# Patient Record
Sex: Male | Born: 1965
Health system: Southern US, Community
[De-identification: ages and names within clinical notes are randomized; demographics above are authoritative.]

---

## 2000-11-23 ENCOUNTER — Emergency Department (HOSPITAL_COMMUNITY): Admission: EM | Admit: 2000-11-23 | Discharge: 2000-11-23 | Payer: Self-pay | Admitting: Emergency Medicine

## 2000-11-23 ENCOUNTER — Emergency Department (HOSPITAL_COMMUNITY): Admission: EM | Admit: 2000-11-23 | Discharge: 2000-11-23 | Payer: Self-pay | Admitting: Internal Medicine

## 2000-11-23 ENCOUNTER — Encounter: Payer: Self-pay | Admitting: Internal Medicine

## 2007-02-17 ENCOUNTER — Emergency Department (HOSPITAL_COMMUNITY): Admission: EM | Admit: 2007-02-17 | Discharge: 2007-02-17 | Payer: Self-pay | Admitting: Emergency Medicine

## 2012-03-15 ENCOUNTER — Encounter (HOSPITAL_COMMUNITY): Payer: Self-pay | Admitting: *Deleted

## 2012-03-15 ENCOUNTER — Emergency Department (HOSPITAL_COMMUNITY): Payer: Self-pay

## 2012-03-15 ENCOUNTER — Emergency Department (HOSPITAL_COMMUNITY)
Admission: EM | Admit: 2012-03-15 | Discharge: 2012-03-16 | Disposition: A | Discharge status: Home / Self Care | Payer: Self-pay | Attending: Emergency Medicine | Admitting: Emergency Medicine

## 2012-03-15 DIAGNOSIS — S20219A Contusion of unspecified front wall of thorax, initial encounter: Secondary | ICD-10-CM | POA: Insufficient documentation

## 2012-03-15 DIAGNOSIS — S199XXA Unspecified injury of neck, initial encounter: Secondary | ICD-10-CM | POA: Insufficient documentation

## 2012-03-15 DIAGNOSIS — S022XXA Fracture of nasal bones, initial encounter for closed fracture: Secondary | ICD-10-CM | POA: Insufficient documentation

## 2012-03-15 DIAGNOSIS — F17200 Nicotine dependence, unspecified, uncomplicated: Secondary | ICD-10-CM | POA: Insufficient documentation

## 2012-03-15 DIAGNOSIS — S0993XA Unspecified injury of face, initial encounter: Secondary | ICD-10-CM | POA: Insufficient documentation

## 2012-03-15 DIAGNOSIS — R040 Epistaxis: Secondary | ICD-10-CM | POA: Insufficient documentation

## 2012-03-15 NOTE — ED Notes (Addendum)
Pt on cell phone
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
## 2012-03-15 NOTE — ED Notes (Signed)
Pt requesting pictures to be taken. Charge nurse to look for camera

## 2012-03-15 NOTE — ED Notes (Addendum)
Pictures were taken per pt's request  with camera out of Clint Bolder, AD office. Will place camera back in her office per charge nurse.
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
## 2012-11-17 ENCOUNTER — Inpatient Hospital Stay (HOSPITAL_COMMUNITY)
Admission: EM | Admit: 2012-11-17 | Discharge: 2012-11-18 | DRG: 909 | Disposition: A | Discharge status: Home / Self Care | Payer: PRIVATE HEALTH INSURANCE | Attending: General Surgery | Admitting: General Surgery

## 2012-11-17 ENCOUNTER — Encounter (HOSPITAL_COMMUNITY): Payer: Self-pay

## 2012-11-17 ENCOUNTER — Encounter (HOSPITAL_COMMUNITY): Admission: EM | Disposition: A | Discharge status: Home / Self Care | Payer: Self-pay | Source: Home / Self Care

## 2012-11-17 ENCOUNTER — Emergency Department (HOSPITAL_COMMUNITY): Payer: PRIVATE HEALTH INSURANCE

## 2012-11-17 ENCOUNTER — Encounter (HOSPITAL_COMMUNITY): Payer: Self-pay | Admitting: Anesthesiology

## 2012-11-17 ENCOUNTER — Emergency Department (HOSPITAL_COMMUNITY): Payer: PRIVATE HEALTH INSURANCE | Admitting: Anesthesiology

## 2012-11-17 DIAGNOSIS — S71111A Laceration without foreign body, right thigh, initial encounter: Secondary | ICD-10-CM

## 2012-11-17 DIAGNOSIS — R209 Unspecified disturbances of skin sensation: Secondary | ICD-10-CM | POA: Diagnosis present

## 2012-11-17 DIAGNOSIS — S71109A Unspecified open wound, unspecified thigh, initial encounter: Secondary | ICD-10-CM

## 2012-11-17 DIAGNOSIS — S71009A Unspecified open wound, unspecified hip, initial encounter: Secondary | ICD-10-CM | POA: Diagnosis present

## 2012-11-17 DIAGNOSIS — S15309A Unspecified injury of unspecified internal jugular vein, initial encounter: Secondary | ICD-10-CM

## 2012-11-17 DIAGNOSIS — S0181XA Laceration without foreign body of other part of head, initial encounter: Secondary | ICD-10-CM

## 2012-11-17 DIAGNOSIS — R404 Transient alteration of awareness: Secondary | ICD-10-CM | POA: Diagnosis present

## 2012-11-17 DIAGNOSIS — S0180XA Unspecified open wound of other part of head, initial encounter: Secondary | ICD-10-CM

## 2012-11-17 DIAGNOSIS — F17200 Nicotine dependence, unspecified, uncomplicated: Secondary | ICD-10-CM | POA: Diagnosis present

## 2012-11-17 DIAGNOSIS — F1410 Cocaine abuse, uncomplicated: Secondary | ICD-10-CM | POA: Diagnosis present

## 2012-11-17 DIAGNOSIS — S1191XA Laceration without foreign body of unspecified part of neck, initial encounter: Secondary | ICD-10-CM

## 2012-11-17 DIAGNOSIS — S1190XA Unspecified open wound of unspecified part of neck, initial encounter: Secondary | ICD-10-CM | POA: Diagnosis present

## 2012-11-17 HISTORY — PX: OTHER SURGICAL HISTORY: SHX169

## 2012-11-17 HISTORY — PX: ENDARTERECTOMY: SHX5162

## 2012-11-17 LAB — CREATININE, SERUM: GFR calc Af Amer: >90 mL/min (ref >90)

## 2012-11-17 LAB — CBC WITH DIFFERENTIAL/PLATELET
Basophils Absolute: 0 10*3/uL (ref 0.0–0.1)
HCT: 38.8 % — ABNORMAL LOW (ref 39.0–52.0)
Hemoglobin: 13.4 g/dL (ref 13.0–17.0)
Lymphocytes Relative: 14 % (ref 12–46)
MCHC: 34.5 g/dL (ref 30.0–36.0)
Monocytes Absolute: 0.9 10*3/uL (ref 0.1–1.0)
Monocytes Relative: 8 % (ref 3–12)
Neutro Abs: 8.8 10*3/uL — ABNORMAL HIGH (ref 1.7–7.7)
Neutrophils Relative %: 77 % (ref 43–77)
Platelets: 253 10*3/uL (ref 150–400)
RDW: 13.1 % (ref 11.5–15.5)
WBC: 11.4 10*3/uL — ABNORMAL HIGH (ref 4.0–10.5)

## 2012-11-17 LAB — COMPREHENSIVE METABOLIC PANEL
ALT: 20 U/L (ref 0–53)
AST: 25 U/L (ref 0–37)
Albumin: 3.5 g/dL (ref 3.5–5.2)
Alkaline Phosphatase: 69 U/L (ref 39–117)
CO2: 21 mEq/L (ref 19–32)
Chloride: 104 mEq/L (ref 96–112)
Creatinine, Ser: 1.12 mg/dL (ref 0.50–1.35)
GFR calc non Af Amer: 75 mL/min — ABNORMAL LOW (ref >90)
Potassium: 3.7 mEq/L (ref 3.5–5.1)
Total Bilirubin: 0.2 mg/dL — ABNORMAL LOW (ref 0.3–1.2)

## 2012-11-17 LAB — PROTIME-INR: INR: 0.95 (ref 0.00–1.49)

## 2012-11-17 LAB — CBC
Hemoglobin: 12.6 g/dL — ABNORMAL LOW (ref 13.0–17.0)
MCH: 32.1 pg (ref 26.0–34.0)
MCHC: 35.4 g/dL (ref 30.0–36.0)
MCV: 90.6 fL (ref 78.0–100.0)
Platelets: 246 10*3/uL (ref 150–400)
RDW: 13.3 % (ref 11.5–15.5)
WBC: 13.4 10*3/uL — ABNORMAL HIGH (ref 4.0–10.5)

## 2012-11-17 LAB — TYPE AND SCREEN: Antibody Screen: NEGATIVE

## 2012-11-17 LAB — ABO/RH: ABO/RH(D): O POS

## 2012-11-17 LAB — MRSA PCR SCREENING: MRSA by PCR: NEGATIVE

## 2012-11-17 SURGERY — ENDARTERECTOMY, CAROTID
Anesthesia: General | Site: Neck | Laterality: Right | Wound class: Dirty or Infected

## 2012-11-17 MED ORDER — SUCCINYLCHOLINE CHLORIDE 20 MG/ML IJ SOLN
INTRAMUSCULAR | Status: DC | PRN
  Administered 2012-11-17: 140 mg via INTRAVENOUS

## 2012-11-17 MED ORDER — TEMAZEPAM 15 MG PO CAPS: 15.0000 mg | ORAL_CAPSULE | Freq: Every evening | ORAL | Status: DC | PRN

## 2012-11-17 MED ORDER — DOCUSATE SODIUM 100 MG PO CAPS
100.0000 mg | ORAL_CAPSULE | Freq: Every day | ORAL | Status: DC
  Administered 2012-11-18: 100 mg via ORAL
  Filled 2012-11-17: qty 1

## 2012-11-17 MED ORDER — METOPROLOL TARTRATE 1 MG/ML IV SOLN: 2.0000 mg | INTRAVENOUS | Status: DC | PRN

## 2012-11-17 MED ORDER — FENTANYL CITRATE 0.05 MG/ML IJ SOLN
INTRAMUSCULAR | Status: AC
  Filled 2012-11-17: qty 2

## 2012-11-17 MED ORDER — FENTANYL CITRATE 0.05 MG/ML IJ SOLN
INTRAMUSCULAR | Status: AC
  Administered 2012-11-17: 50 ug via INTRAVENOUS
  Filled 2012-11-17: qty 2

## 2012-11-17 MED ORDER — GUAIFENESIN-DM 100-10 MG/5ML PO SYRP: 15.0000 mL | ORAL_SOLUTION | ORAL | Status: DC | PRN

## 2012-11-17 MED ORDER — PHENOL 1.4 % MT LIQD: 1.0000 | OROMUCOSAL | Status: DC | PRN

## 2012-11-17 MED ORDER — 0.9 % SODIUM CHLORIDE (POUR BTL) OPTIME
TOPICAL | Status: DC | PRN
  Administered 2012-11-17: 2000 mL

## 2012-11-17 MED ORDER — OXYCODONE HCL 5 MG PO TABS
ORAL_TABLET | ORAL | Status: AC
  Administered 2012-11-17: 10 mg
  Filled 2012-11-17: qty 2

## 2012-11-17 MED ORDER — DOPAMINE-DEXTROSE 3.2-5 MG/ML-% IV SOLN: 3.0000 ug/kg/min | INTRAVENOUS | Status: DC

## 2012-11-17 MED ORDER — FENTANYL CITRATE 0.05 MG/ML IJ SOLN
25.0000 ug | INTRAMUSCULAR | Status: DC | PRN
  Administered 2012-11-17 (×4): 25 ug via INTRAVENOUS
  Administered 2012-11-17: 50 ug via INTRAVENOUS

## 2012-11-17 MED ORDER — SODIUM CHLORIDE 0.9 % IV SOLN
INTRAVENOUS | Status: DC
  Administered 2012-11-17: 14:00:00 via INTRAVENOUS
  Administered 2012-11-18: 1000 mL via INTRAVENOUS

## 2012-11-17 MED ORDER — ACETAMINOPHEN 650 MG RE SUPP: 325.0000 mg | RECTAL | Status: DC | PRN

## 2012-11-17 MED ORDER — CEFAZOLIN SODIUM-DEXTROSE 2-3 GM-% IV SOLR
INTRAVENOUS | Status: DC | PRN
  Administered 2012-11-17: 2 g via INTRAVENOUS

## 2012-11-17 MED ORDER — METOCLOPRAMIDE HCL 5 MG/ML IJ SOLN: 10.0000 mg | Freq: Once | INTRAMUSCULAR | Status: DC | PRN

## 2012-11-17 MED ORDER — IOHEXOL 350 MG/ML SOLN
100.0000 mL | Freq: Once | INTRAVENOUS | Status: AC | PRN
  Administered 2012-11-17: 100 mL via INTRAVENOUS

## 2012-11-17 MED ORDER — LACTATED RINGERS IV SOLN
INTRAVENOUS | Status: DC | PRN
  Administered 2012-11-17 (×2): via INTRAVENOUS

## 2012-11-17 MED ORDER — MORPHINE SULFATE 2 MG/ML IJ SOLN
2.0000 mg | INTRAMUSCULAR | Status: DC | PRN
  Administered 2012-11-17 (×2): 2 mg via INTRAVENOUS

## 2012-11-17 MED ORDER — THROMBIN 20000 UNITS EX SOLR
CUTANEOUS | Status: AC
  Filled 2012-11-17: qty 20000

## 2012-11-17 MED ORDER — VECURONIUM BROMIDE 10 MG IV SOLR
INTRAVENOUS | Status: DC | PRN
  Administered 2012-11-17: 2 mg via INTRAVENOUS

## 2012-11-17 MED ORDER — SODIUM CHLORIDE 0.9 % IV SOLN
Freq: Once | INTRAVENOUS | Status: AC
  Administered 2012-11-17: 04:00:00 via INTRAVENOUS

## 2012-11-17 MED ORDER — HYDRALAZINE HCL 20 MG/ML IJ SOLN: 10.0000 mg | INTRAMUSCULAR | Status: DC | PRN

## 2012-11-17 MED ORDER — POTASSIUM CHLORIDE CRYS ER 20 MEQ PO TBCR: 20.0000 meq | EXTENDED_RELEASE_TABLET | Freq: Once | ORAL | Status: AC | PRN

## 2012-11-17 MED ORDER — PROPOFOL 10 MG/ML IV BOLUS
INTRAVENOUS | Status: DC | PRN
  Administered 2012-11-17: 40 mg via INTRAVENOUS
  Administered 2012-11-17: 160 mg via INTRAVENOUS

## 2012-11-17 MED ORDER — SODIUM CHLORIDE 0.9 % IV BOLUS (SEPSIS): 1000.0000 mL | Freq: Once | INTRAVENOUS | Status: DC

## 2012-11-17 MED ORDER — SODIUM CHLORIDE 0.9 % IR SOLN
Status: DC | PRN
  Administered 2012-11-17: 08:00:00

## 2012-11-17 MED ORDER — ACETAMINOPHEN 325 MG PO TABS: 325.0000 mg | ORAL_TABLET | ORAL | Status: DC | PRN

## 2012-11-17 MED ORDER — DEXTROSE 5 % IV SOLN
1.5000 g | Freq: Two times a day (BID) | INTRAVENOUS | Status: AC
  Administered 2012-11-17 – 2012-11-18 (×2): 1.5 g via INTRAVENOUS
  Filled 2012-11-17 (×2): qty 1.5

## 2012-11-17 MED ORDER — SODIUM CHLORIDE 0.9 % IV SOLN
INTRAVENOUS | Status: DC | PRN
  Administered 2012-11-17: 07:00:00 via INTRAVENOUS

## 2012-11-17 MED ORDER — OXYCODONE HCL 5 MG PO TABS
5.0000 mg | ORAL_TABLET | ORAL | Status: DC | PRN
  Administered 2012-11-17 – 2012-11-18 (×3): 10 mg via ORAL
  Filled 2012-11-17 (×2): qty 2

## 2012-11-17 MED ORDER — PNEUMOCOCCAL VAC POLYVALENT 25 MCG/0.5ML IJ INJ
0.5000 mL | INJECTION | INTRAMUSCULAR | Status: AC
  Administered 2012-11-18: 0.5 mL via INTRAMUSCULAR
  Filled 2012-11-17: qty 0.5

## 2012-11-17 MED ORDER — FENTANYL CITRATE 0.05 MG/ML IJ SOLN
INTRAMUSCULAR | Status: DC | PRN
  Administered 2012-11-17: 100 ug via INTRAVENOUS

## 2012-11-17 MED ORDER — ENOXAPARIN SODIUM 40 MG/0.4ML ~~LOC~~ SOLN
40.0000 mg | SUBCUTANEOUS | Status: DC
  Administered 2012-11-18: 40 mg via SUBCUTANEOUS
  Filled 2012-11-17: qty 0.4

## 2012-11-17 MED ORDER — ONDANSETRON HCL 4 MG/2ML IJ SOLN: 4.0000 mg | Freq: Four times a day (QID) | INTRAMUSCULAR | Status: DC | PRN

## 2012-11-17 MED ORDER — CEFAZOLIN SODIUM-DEXTROSE 2-3 GM-% IV SOLR
INTRAVENOUS | Status: AC
  Filled 2012-11-17: qty 50

## 2012-11-17 MED ORDER — SODIUM CHLORIDE 0.9 % IV SOLN: 500.0000 mL | Freq: Once | INTRAVENOUS | Status: AC | PRN

## 2012-11-17 MED ORDER — MORPHINE SULFATE 4 MG/ML IJ SOLN
INTRAMUSCULAR | Status: AC
  Filled 2012-11-17: qty 1

## 2012-11-17 MED ORDER — ALUM & MAG HYDROXIDE-SIMETH 200-200-20 MG/5ML PO SUSP: 15.0000 mL | ORAL | Status: DC | PRN

## 2012-11-17 MED ORDER — OXYCODONE HCL 5 MG PO TABS
ORAL_TABLET | ORAL | Status: AC
  Filled 2012-11-17: qty 2

## 2012-11-17 MED ORDER — LABETALOL HCL 5 MG/ML IV SOLN: 10.0000 mg | INTRAVENOUS | Status: DC | PRN

## 2012-11-17 MED ORDER — ONDANSETRON HCL 4 MG/2ML IJ SOLN
INTRAMUSCULAR | Status: DC | PRN
  Administered 2012-11-17: 4 mg via INTRAVENOUS

## 2012-11-17 MED ORDER — PANTOPRAZOLE SODIUM 40 MG PO TBEC
40.0000 mg | DELAYED_RELEASE_TABLET | Freq: Every day | ORAL | Status: DC
  Administered 2012-11-17 – 2012-11-18 (×2): 40 mg via ORAL
  Filled 2012-11-17 (×2): qty 1

## 2012-11-17 SURGICAL SUPPLY — 49 items
CANISTER SUCTION 2500CC (MISCELLANEOUS) ×2 IMPLANT
CANNULA VESSEL 3MM 2 BLNT TIP (CANNULA) ×2 IMPLANT
CATH ROBINSON RED A/P 18FR (CATHETERS) IMPLANT
CLIP TI MEDIUM 6 (CLIP) ×2 IMPLANT
CLIP TI WIDE RED SMALL 6 (CLIP) ×2 IMPLANT
CLSR STERI-STRIP ANTIMIC 1/2X4 (GAUZE/BANDAGES/DRESSINGS) ×2 IMPLANT
COVER SURGICAL LIGHT HANDLE (MISCELLANEOUS) ×2 IMPLANT
CRADLE DONUT ADULT HEAD (MISCELLANEOUS) ×2 IMPLANT
DECANTER SPIKE VIAL GLASS SM (MISCELLANEOUS) IMPLANT
DERMABOND ADVANCED (GAUZE/BANDAGES/DRESSINGS) ×1
DERMABOND ADVANCED .7 DNX12 (GAUZE/BANDAGES/DRESSINGS) ×1 IMPLANT
DRAIN HEMOVAC 1/8 X 5 (WOUND CARE) IMPLANT
DRAPE WARM FLUID 44X44 (DRAPE) ×2 IMPLANT
ELECT REM PT RETURN 9FT ADLT (ELECTROSURGICAL) ×2
ELECTRODE REM PT RTRN 9FT ADLT (ELECTROSURGICAL) ×1 IMPLANT
EVACUATOR SILICONE 100CC (DRAIN) IMPLANT
GAUZE SPONGE 2X2 8PLY STRL LF (GAUZE/BANDAGES/DRESSINGS) ×1 IMPLANT
GEL ULTRASOUND 20GR AQUASONIC (MISCELLANEOUS) ×2 IMPLANT
GLOVE BIO SURGEON STRL SZ 6.5 (GLOVE) ×6 IMPLANT
GLOVE BIO SURGEON STRL SZ7.5 (GLOVE) ×2 IMPLANT
GLOVE BIOGEL PI IND STRL 6.5 (GLOVE) ×4 IMPLANT
GLOVE BIOGEL PI INDICATOR 6.5 (GLOVE) ×4
GOWN PREVENTION PLUS XLARGE (GOWN DISPOSABLE) ×2 IMPLANT
GOWN STRL NON-REIN LRG LVL3 (GOWN DISPOSABLE) ×4 IMPLANT
KIT BASIN OR (CUSTOM PROCEDURE TRAY) ×2 IMPLANT
KIT ROOM TURNOVER OR (KITS) ×2 IMPLANT
LOOP VESSEL MINI RED (MISCELLANEOUS) IMPLANT
NEEDLE HYPO 25GX1X1/2 BEV (NEEDLE) IMPLANT
NS IRRIG 1000ML POUR BTL (IV SOLUTION) ×4 IMPLANT
PACK CAROTID (CUSTOM PROCEDURE TRAY) ×2 IMPLANT
PAD ARMBOARD 7.5X6 YLW CONV (MISCELLANEOUS) ×4 IMPLANT
SHUNT CAROTID BYPASS 10 (VASCULAR PRODUCTS) IMPLANT
SHUNT CAROTID BYPASS 12FRX15.5 (VASCULAR PRODUCTS) IMPLANT
SPONGE GAUZE 2X2 STER 10/PKG (GAUZE/BANDAGES/DRESSINGS) ×1
SPONGE SURGIFOAM ABS GEL 100 (HEMOSTASIS) IMPLANT
SUT ETHILON 3 0 PS 1 (SUTURE) IMPLANT
SUT PROLENE 5 0 C 1 24 (SUTURE) ×4 IMPLANT
SUT PROLENE 6 0 CC (SUTURE) ×2 IMPLANT
SUT PROLENE 7 0 BV 1 (SUTURE) IMPLANT
SUT SILK 3 0 TIES 17X18 (SUTURE)
SUT SILK 3-0 18XBRD TIE BLK (SUTURE) IMPLANT
SUT VIC AB 3-0 SH 27 (SUTURE) ×1
SUT VIC AB 3-0 SH 27X BRD (SUTURE) ×1 IMPLANT
SUT VICRYL 4-0 PS2 18IN ABS (SUTURE) ×2 IMPLANT
SYR CONTROL 10ML LL (SYRINGE) IMPLANT
TAPE CLOTH SURG 4X10 WHT LF (GAUZE/BANDAGES/DRESSINGS) ×2 IMPLANT
TOWEL OR 17X24 6PK STRL BLUE (TOWEL DISPOSABLE) ×2 IMPLANT
TOWEL OR 17X26 10 PK STRL BLUE (TOWEL DISPOSABLE) ×2 IMPLANT
WATER STERILE IRR 1000ML POUR (IV SOLUTION) ×2 IMPLANT

## 2012-11-17 NOTE — OR Nursing (Signed)
Two bags of patient belongings brought to Operating Room with the patient.  Patient underwear placed in belongings and then bags remained closed during case and were labeled with patient ID sticker.
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
## 2012-11-17 NOTE — ED Notes (Signed)
Transported patient to OR assignment.
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
## 2012-11-17 NOTE — ED Notes (Signed)
Called Thayer Ohm RN at [facility] ED, report given, transport leaving with pt.
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
## 2012-11-17 NOTE — Anesthesia Preprocedure Evaluation (Addendum)
Anesthesia Evaluation  Patient identified by MRN, date of birth, ID band Patient awake    Reviewed: Allergy & Precautions, H&P , NPO status , Patient's Chart, lab work & pertinent test results, reviewed documented beta blocker date and time   Airway Mallampati: III TM Distance: >3 FB Neck ROM: full Positive for:  Tracheal deviation Mouth opening: Limited Mouth Opening  Dental   Pulmonary Current Smoker,  breath sounds clear to auscultation        Cardiovascular Rhythm:regular     Neuro/Psych negative neurological ROS  negative psych ROS   GI/Hepatic negative GI ROS, Neg liver ROS,   Endo/Other  negative endocrine ROS  Renal/GU negative Renal ROS  negative genitourinary   Musculoskeletal   Abdominal   Peds  Hematology negative hematology ROS (+)   Anesthesia Other Findings See surgeon's H&P   Reproductive/Obstetrics negative OB ROS                           Anesthesia Physical Anesthesia Plan  ASA: IV and emergent  Anesthesia Plan: General   Post-op Pain Management:    Induction: Intravenous, Rapid sequence and Cricoid pressure planned  Airway Management Planned: Video Laryngoscope Planned  Additional Equipment:   Intra-op Plan:   Post-operative Plan: Extubation in OR  Informed Consent: I have reviewed the patients History and Physical, chart, labs and discussed the procedure including the risks, benefits and alternatives for the proposed anesthesia with the patient or authorized representative who has indicated his/her understanding and acceptance.   Dental Advisory Given  Plan Discussed with: CRNA and Surgeon  Anesthesia Plan Comments:         Anesthesia Quick Evaluation

## 2012-11-17 NOTE — Anesthesia Procedure Notes (Signed)
Procedure Name: Intubation Date/Time: 11/17/2012 7:00 AM Performed by: Alanda Amass A Pre-anesthesia Checklist: Patient identified, Timeout performed, Emergency Drugs available, Suction available and Patient being monitored Patient Re-evaluated:Patient Re-evaluated prior to inductionOxygen Delivery Method: Circle system utilized Preoxygenation: Pre-oxygenation with 100% oxygen Intubation Type: IV induction, Rapid sequence and Cricoid Pressure applied Tube type: Oral Tube size: 7.5 mm Number of attempts: 1 Airway Equipment and Method: Video-laryngoscopy and Stylet Placement Confirmation: ETT inserted through vocal cords under direct vision,  breath sounds checked- equal and bilateral and positive ETCO2 Secured at: 21 cm Tube secured with: Tape Dental Injury: Teeth and Oropharynx as per pre-operative assessment
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
## 2012-11-17 NOTE — Preoperative (Signed)
Beta Blockers   Reason not to administer Beta Blockers:Not on Beta Blocker

## 2012-11-18 ENCOUNTER — Telehealth: Payer: Self-pay | Admitting: Vascular Surgery

## 2012-11-18 LAB — BASIC METABOLIC PANEL
BUN: 9 mg/dL (ref 6–23)
CO2: 25 mEq/L (ref 19–32)
Calcium: 8.2 mg/dL — ABNORMAL LOW (ref 8.4–10.5)
Creatinine, Ser: 0.91 mg/dL (ref 0.50–1.35)
GFR calc non Af Amer: >90 mL/min (ref >90)
Glucose, Bld: 118 mg/dL — ABNORMAL HIGH (ref 70–99)
Potassium: 4 mEq/L (ref 3.5–5.1)
Sodium: 135 mEq/L (ref 135–145)

## 2012-11-18 LAB — CBC
HCT: 36.3 % — ABNORMAL LOW (ref 39.0–52.0)
Hemoglobin: 12.2 g/dL — ABNORMAL LOW (ref 13.0–17.0)
MCH: 30.7 pg (ref 26.0–34.0)
MCHC: 33.6 g/dL (ref 30.0–36.0)
MCV: 91.2 fL (ref 78.0–100.0)
RBC: 3.98 MIL/uL — ABNORMAL LOW (ref 4.22–5.81)

## 2012-11-18 MED ORDER — OXYCODONE HCL 5 MG PO TABS: 5.0000 mg | ORAL_TABLET | Freq: Four times a day (QID) | ORAL | Status: DC | PRN

## 2012-11-18 NOTE — Discharge Summary (Signed)
Seen, agree with above.  Eating, wound looks OK.   Discharge to home.
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
## 2012-11-18 NOTE — Progress Notes (Signed)
UR completed.  Carleen Rhue, RN BSN M[facility] CCM Trauma/Neuro ICU Case Manager [phone]  
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
## 2012-11-18 NOTE — Progress Notes (Signed)
Pt discharged to home via taxi voucher to dad's home. Discharged instructions, f/u appt.,prescriptions, discussed and explained to pt.
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
## 2012-11-18 NOTE — Progress Notes (Signed)
Nutrition Brief Note  Patient identified on the Malnutrition Screening Tool (MST) Report for recent weight lost without trying.  Per readings below, patient has has 3% weight loss since January 2014; not significant for time frame.  Wt Readings from Last 15 Encounters:  11/17/12 143 lb 4.8 oz (65 kg)  11/17/12 143 lb 4.8 oz (65 kg)  03/15/12 147 lb (66.679 kg)    Body mass index is 21.15 kg/(m^2). Patient meets criteria for Normal based on current BMI.   Current diet order is Regular, patient is consuming approximately 100% of meals at this time. Labs and medications reviewed.   No nutrition interventions warranted at this time. If nutrition issues arise, please consult RD.   Maureen Chatters, RD, LDN Pager #: [phone] After-Hours Pager #: [phone]
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
## 2012-11-19 ENCOUNTER — Encounter (HOSPITAL_COMMUNITY): Payer: Self-pay | Admitting: Vascular Surgery

## 2012-11-27 ENCOUNTER — Telehealth: Payer: Self-pay

## 2012-11-27 NOTE — Telephone Encounter (Signed)
Pt. 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  C/o numbness in right shoulder down to right thumb since started lifting the heavy boxes; reports the numbness is continuous.  Reports that "the right hand feels chilly compared to the left hand".  Instructed pt. on checking capillary refill; he stated that the color of the nailbeds "look normal" after squeezing each finger.  C/o a continuous shooting pain in the thumb area since Monday, when he resumed work.  Questioned pt. on ability to grip with (R) hand; stated he has trouble picking-up 2 liter bottles, and in squeezing the staple gun at work, due to weakness in gripping ability.  Advised pt. To inform employer he is instructed to avoid heavy lifting x 2 weeks, and will not be released to full work activities until seen in office 12/05/12.  Verb. Understanding.  Will make Dr. Darrick Penna aware of symptoms.

## 2012-12-02 ENCOUNTER — Telehealth (HOSPITAL_COMMUNITY): Payer: Self-pay | Admitting: Emergency Medicine

## 2012-12-02 NOTE — Telephone Encounter (Signed)
Has an appointment with vascular surgery on Thursday and is just going to wait until then to address it.

## 2012-12-04 ENCOUNTER — Encounter: Payer: Self-pay | Admitting: Vascular Surgery

## 2012-12-05 ENCOUNTER — Encounter: Payer: Self-pay | Admitting: Vascular Surgery

## 2012-12-05 ENCOUNTER — Ambulatory Visit (INDEPENDENT_AMBULATORY_CARE_PROVIDER_SITE_OTHER): Payer: Self-pay | Admitting: Vascular Surgery

## 2012-12-05 VITALS — BP 123/69 | HR 76 | Ht 69.0 in | Wt 146.2 lb

## 2012-12-05 DIAGNOSIS — T819XXS Unspecified complication of procedure, sequela: Secondary | ICD-10-CM

## 2012-12-05 DIAGNOSIS — S1191XD Laceration without foreign body of unspecified part of neck, subsequent encounter: Secondary | ICD-10-CM

## 2012-12-05 DIAGNOSIS — T889XXS Complication of surgical and medical care, unspecified, sequela: Secondary | ICD-10-CM

## 2012-12-05 DIAGNOSIS — Z5189 Encounter for other specified aftercare: Secondary | ICD-10-CM

## 2012-12-05 MED ORDER — TRAMADOL HCL 50 MG PO TABS: 50.0000 mg | ORAL_TABLET | Freq: Four times a day (QID) | ORAL | Status: DC | PRN
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
## 2012-12-10 NOTE — Addendum Note (Signed)
Addended by: Sharee Pimple on: 12/10/2012 08:50 AM   Modules accepted: Orders

## 2012-12-12 ENCOUNTER — Telehealth: Payer: Self-pay | Admitting: Vascular Surgery

## 2012-12-12 NOTE — Telephone Encounter (Signed)
Patient was going to contact case worker to see if he can get his medicaid so he would be able to have this test done. I spoke to him on 12/10/12 and he said he was still trying to get through to her. I told him that if he would like me to speak with her, to have her call me. I have not heard from either. Called his house today 12/12/12. He was not home. Friend said he would have him call when he gets in.

## 2012-12-13 ENCOUNTER — Other Ambulatory Visit: Payer: Self-pay

## 2012-12-13 DIAGNOSIS — Z48812 Encounter for surgical aftercare following surgery on the circulatory system: Secondary | ICD-10-CM

## 2012-12-13 DIAGNOSIS — S1191XD Laceration without foreign body of unspecified part of neck, subsequent encounter: Secondary | ICD-10-CM

## 2012-12-19 ENCOUNTER — Other Ambulatory Visit: Payer: Self-pay | Admitting: *Deleted

## 2012-12-19 DIAGNOSIS — R29898 Other symptoms and signs involving the musculoskeletal system: Secondary | ICD-10-CM

## 2012-12-19 DIAGNOSIS — R200 Anesthesia of skin: Secondary | ICD-10-CM

## 2012-12-19 DIAGNOSIS — Z48812 Encounter for surgical aftercare following surgery on the circulatory system: Secondary | ICD-10-CM

## 2012-12-30 ENCOUNTER — Ambulatory Visit: Payer: Self-pay | Admitting: Physical Therapy

## 2013-01-07 ENCOUNTER — Ambulatory Visit: Payer: PRIVATE HEALTH INSURANCE | Attending: Vascular Surgery | Admitting: Physical Therapy

## 2013-01-07 DIAGNOSIS — M6281 Muscle weakness (generalized): Secondary | ICD-10-CM | POA: Insufficient documentation

## 2013-01-07 DIAGNOSIS — IMO0001 Reserved for inherently not codable concepts without codable children: Secondary | ICD-10-CM | POA: Insufficient documentation

## 2013-01-07 DIAGNOSIS — R5381 Other malaise: Secondary | ICD-10-CM | POA: Insufficient documentation

## 2013-01-07 DIAGNOSIS — M25519 Pain in unspecified shoulder: Secondary | ICD-10-CM | POA: Insufficient documentation

## 2013-01-13 ENCOUNTER — Ambulatory Visit: Payer: PRIVATE HEALTH INSURANCE | Attending: Vascular Surgery | Admitting: Physical Therapy

## 2013-01-13 DIAGNOSIS — M6281 Muscle weakness (generalized): Secondary | ICD-10-CM | POA: Insufficient documentation

## 2013-01-13 DIAGNOSIS — R5381 Other malaise: Secondary | ICD-10-CM | POA: Insufficient documentation

## 2013-01-13 DIAGNOSIS — Z0279 Encounter for issue of other medical certificate: Secondary | ICD-10-CM

## 2013-01-13 DIAGNOSIS — R29898 Other symptoms and signs involving the musculoskeletal system: Secondary | ICD-10-CM | POA: Insufficient documentation

## 2013-01-13 DIAGNOSIS — M25519 Pain in unspecified shoulder: Secondary | ICD-10-CM | POA: Insufficient documentation

## 2013-01-13 DIAGNOSIS — IMO0001 Reserved for inherently not codable concepts without codable children: Secondary | ICD-10-CM | POA: Insufficient documentation

## 2013-01-17 ENCOUNTER — Encounter: Payer: Self-pay | Admitting: Physical Therapy

## 2013-01-22 ENCOUNTER — Ambulatory Visit: Payer: PRIVATE HEALTH INSURANCE | Admitting: Physical Therapy

## 2013-01-28 ENCOUNTER — Ambulatory Visit: Payer: PRIVATE HEALTH INSURANCE | Admitting: Physical Therapy

## 2013-01-29 ENCOUNTER — Encounter: Payer: Self-pay | Admitting: Vascular Surgery

## 2013-01-30 ENCOUNTER — Encounter: Payer: Self-pay | Admitting: Vascular Surgery

## 2013-01-30 ENCOUNTER — Ambulatory Visit (INDEPENDENT_AMBULATORY_CARE_PROVIDER_SITE_OTHER): Payer: PRIVATE HEALTH INSURANCE | Admitting: Vascular Surgery

## 2013-01-30 VITALS — BP 140/87 | HR 98 | Resp 18 | Ht 69.0 in | Wt 153.0 lb

## 2013-01-30 DIAGNOSIS — R200 Anesthesia of skin: Secondary | ICD-10-CM | POA: Insufficient documentation

## 2013-01-30 DIAGNOSIS — M6281 Muscle weakness (generalized): Secondary | ICD-10-CM

## 2013-01-30 DIAGNOSIS — R29898 Other symptoms and signs involving the musculoskeletal system: Secondary | ICD-10-CM

## 2013-01-30 DIAGNOSIS — R209 Unspecified disturbances of skin sensation: Secondary | ICD-10-CM

## 2013-01-30 NOTE — Addendum Note (Signed)
Addended by: Sharee Pimple on: 01/30/2013 02:39 PM   Modules accepted: Orders
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
## 2013-02-04 ENCOUNTER — Telehealth: Payer: Self-pay | Admitting: Vascular Surgery

## 2013-02-04 ENCOUNTER — Encounter: Payer: PRIVATE HEALTH INSURANCE | Admitting: Physical Therapy

## 2013-02-04 NOTE — Telephone Encounter (Signed)
A referral was made to [facility] in [place] as per Dr. Darrick Penna request on 01/30/2013.  Irving Burton called from [place] today to notify our office that they are unable to schedule Frederick Roth due to his inability to pay out of pocket. He is self-pay and Medicaid is pending at this time.  Irving Burton instructed Frederick Studnicka to call to schedule an appointment when he is financially able, or when Medicaid is approved.  Berton Mount, [facility] [place] [phone]

## 2013-02-19 ENCOUNTER — Encounter: Payer: PRIVATE HEALTH INSURANCE | Admitting: Physical Therapy

## 2013-02-20 ENCOUNTER — Encounter: Payer: PRIVATE HEALTH INSURANCE | Admitting: Physical Therapy

## 2013-02-27 ENCOUNTER — Telehealth: Payer: Self-pay
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
## 2013-03-03 ENCOUNTER — Encounter: Payer: Self-pay | Admitting: *Deleted

## 2013-03-04 ENCOUNTER — Ambulatory Visit: Payer: Self-pay | Attending: Vascular Surgery | Admitting: Physical Therapy

## 2013-03-04 DIAGNOSIS — M25519 Pain in unspecified shoulder: Secondary | ICD-10-CM | POA: Insufficient documentation

## 2013-03-04 DIAGNOSIS — M6281 Muscle weakness (generalized): Secondary | ICD-10-CM | POA: Insufficient documentation

## 2013-03-04 DIAGNOSIS — IMO0001 Reserved for inherently not codable concepts without codable children: Secondary | ICD-10-CM | POA: Insufficient documentation

## 2013-03-04 DIAGNOSIS — R29898 Other symptoms and signs involving the musculoskeletal system: Secondary | ICD-10-CM | POA: Insufficient documentation

## 2013-03-04 DIAGNOSIS — R5381 Other malaise: Secondary | ICD-10-CM | POA: Insufficient documentation

## 2013-03-11 ENCOUNTER — Ambulatory Visit: Payer: Self-pay | Admitting: Physical Therapy

## 2013-04-15 DIAGNOSIS — Z0279 Encounter for issue of other medical certificate: Secondary | ICD-10-CM

## 2016-06-30 ENCOUNTER — Ambulatory Visit (HOSPITAL_COMMUNITY)
Admission: RE | Admit: 2016-06-30 | Discharge: 2016-06-30 | Disposition: A | Discharge status: Home / Self Care | Payer: Self-pay | Source: Ambulatory Visit | Attending: Family Medicine | Admitting: Family Medicine

## 2016-06-30 ENCOUNTER — Encounter: Payer: Self-pay | Admitting: Family Medicine

## 2016-06-30 ENCOUNTER — Ambulatory Visit: Payer: Self-pay | Attending: Family Medicine | Admitting: Family Medicine

## 2016-06-30 VITALS — BP 119/72 | HR 61 | Temp 98.0°F | Resp 18 | Ht 69.0 in | Wt 146.0 lb

## 2016-06-30 DIAGNOSIS — H538 Other visual disturbances: Secondary | ICD-10-CM | POA: Insufficient documentation

## 2016-06-30 DIAGNOSIS — R202 Paresthesia of skin: Secondary | ICD-10-CM | POA: Insufficient documentation

## 2016-06-30 DIAGNOSIS — H269 Unspecified cataract: Secondary | ICD-10-CM | POA: Insufficient documentation

## 2016-06-30 DIAGNOSIS — Z0000 Encounter for general adult medical examination without abnormal findings: Secondary | ICD-10-CM

## 2016-06-30 DIAGNOSIS — R531 Weakness: Secondary | ICD-10-CM | POA: Insufficient documentation

## 2016-06-30 DIAGNOSIS — Z87828 Personal history of other (healed) physical injury and trauma: Secondary | ICD-10-CM

## 2016-06-30 DIAGNOSIS — Z79899 Other long term (current) drug therapy: Secondary | ICD-10-CM | POA: Insufficient documentation

## 2016-06-30 DIAGNOSIS — M898X9 Other specified disorders of bone, unspecified site: Secondary | ICD-10-CM

## 2016-06-30 DIAGNOSIS — Z8669 Personal history of other diseases of the nervous system and sense organs: Secondary | ICD-10-CM

## 2016-06-30 DIAGNOSIS — H547 Unspecified visual loss: Secondary | ICD-10-CM

## 2016-06-30 DIAGNOSIS — M79609 Pain in unspecified limb: Secondary | ICD-10-CM | POA: Insufficient documentation

## 2016-06-30 MED ORDER — MELOXICAM 7.5 MG PO TABS
7.5000 mg | ORAL_TABLET | Freq: Every day | ORAL | 1 refills | Status: DC
Start: 2016-06-30 — End: 2017-07-17

## 2016-06-30 NOTE — Progress Notes (Signed)
Patient is here to establish care  Patient has taken medication today. Patient has eaten today.  Patient complains of left eye sight being obstructed. Patient has cataract surgery and [facility]
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
## 2016-06-30 NOTE — Patient Instructions (Addendum)
Apply for orange card to complete referral process.   Paresthesia Paresthesia is a burning or prickling feeling. This feeling can happen in any part of the body. It often happens in the hands, arms, legs, or feet. Usually, it is not painful. In most cases, the feeling goes away in a short time and is not a sign of a serious problem. Follow these instructions at home:  Avoid drinking alcohol.  Try massage or needle therapy (acupuncture) to help with your problems.  Keep all follow-up visits as told by your doctor. This is important. Contact a doctor if:  You keep on having episodes of paresthesia.  Your burning or prickling feeling gets worse when you walk.  You have pain or cramps.  You feel dizzy.  You have a rash. Get help right away if:  You feel weak.  You have trouble walking or moving.  You have problems speaking, understanding, or seeing.  You feel confused.  You cannot control when you pee (urinate) or poop (bowel movement).  You lose feeling (numbness) after an injury.  You pass out (faint). This information is not intended to replace advice given to you by your health care provider. Make sure you discuss any questions you have with your health care provider. Document Released: 01/13/2008 Document Revised: 07/08/2015 Document Reviewed: 01/26/2014 Elsevier Interactive Patient Education  2017 [employer]

## 2016-07-01 LAB — HIV ANTIBODY (ROUTINE TESTING W REFLEX): HIV SCREEN 4TH GENERATION: NONREACTIVE

## 2016-07-01 LAB — HEPATITIS C ANTIBODY

## 2016-07-07 ENCOUNTER — Telehealth: Payer: Self-pay | Admitting: *Deleted

## 2016-07-07 NOTE — Telephone Encounter (Signed)
MA unable to leave a voice message due to system not being set up. Please inform the patient of no fractures/dislocation was noted on the xray. HIV and Hep C were negative. Patient needs to complete Financial process.

## 2016-07-07 NOTE — Telephone Encounter (Signed)
-----   Message from Mandesia R Hairston, FNP sent at 07/07/2016 10:15 AM EDT ----- X-ray of right elbow showed no evidence of fracture, dislocation, abnormal bone, or swelling of the joint. Follow up with orthopedic referral. Apply for orange card to complete referral process.  HIV is negative.  Hepatitis C is negative 

## 2016-07-12 ENCOUNTER — Ambulatory Visit: Payer: Self-pay | Attending: Family Medicine

## 2016-07-12 ENCOUNTER — Telehealth: Payer: Self-pay

## 2016-07-12 NOTE — Telephone Encounter (Signed)
-----   Message from Mandesia R Hairston, FNP sent at 07/07/2016 10:15 AM EDT ----- X-ray of right elbow showed no evidence of fracture, dislocation, abnormal bone, or swelling of the joint. Follow up with orthopedic referral. Apply for orange card to complete referral process.  HIV is negative.  Hepatitis C is negative 

## 2016-07-12 NOTE — Telephone Encounter (Signed)
CMA call regarding results  Patient did not answer & no VM set up

## 2016-07-17 NOTE — Telephone Encounter (Signed)
CMA second time calling regarding lab results  Patient did not answer & no VM set up yet

## 2016-07-17 NOTE — Telephone Encounter (Signed)
-----   Message from Lizbeth BarkMandesia R Hairston, [place] sent at 07/07/2016 10:15 AM EDT ----- X-ray of right elbow showed no evidence of fracture, dislocation, abnormal bone, or swelling of the joint. Follow up with orthopedic referral. Apply for orange card to complete referral process.  HIV is negative.  Hepatitis C is negative

## 2016-10-24 ENCOUNTER — Ambulatory Visit: Payer: Self-pay | Attending: Family Medicine | Admitting: Family Medicine

## 2016-10-24 ENCOUNTER — Encounter: Payer: Self-pay | Admitting: Family Medicine

## 2016-10-24 VITALS — BP 103/67 | HR 54 | Temp 98.0°F | Resp 18 | Ht 69.0 in | Wt 157.8 lb

## 2016-10-24 DIAGNOSIS — R202 Paresthesia of skin: Secondary | ICD-10-CM | POA: Insufficient documentation

## 2016-10-24 DIAGNOSIS — Z87828 Personal history of other (healed) physical injury and trauma: Secondary | ICD-10-CM

## 2016-10-24 DIAGNOSIS — S39012A Strain of muscle, fascia and tendon of lower back, initial encounter: Secondary | ICD-10-CM | POA: Insufficient documentation

## 2016-10-24 DIAGNOSIS — X58XXXA Exposure to other specified factors, initial encounter: Secondary | ICD-10-CM | POA: Insufficient documentation

## 2016-10-24 DIAGNOSIS — M79609 Pain in unspecified limb: Secondary | ICD-10-CM

## 2016-10-24 DIAGNOSIS — Z79899 Other long term (current) drug therapy: Secondary | ICD-10-CM | POA: Insufficient documentation

## 2016-10-24 DIAGNOSIS — H538 Other visual disturbances: Secondary | ICD-10-CM | POA: Insufficient documentation

## 2016-10-24 DIAGNOSIS — M5442 Lumbago with sciatica, left side: Secondary | ICD-10-CM

## 2016-10-24 DIAGNOSIS — M5432 Sciatica, left side: Secondary | ICD-10-CM | POA: Insufficient documentation

## 2016-10-24 MED ORDER — METHOCARBAMOL 500 MG PO TABS: 500.0000 mg | ORAL_TABLET | Freq: Three times a day (TID) | ORAL | 0 refills | Status: DC | PRN

## 2016-10-24 MED ORDER — TRAMADOL HCL 50 MG PO TABS: 50.0000 mg | ORAL_TABLET | Freq: Three times a day (TID) | ORAL | 0 refills | Status: DC | PRN

## 2016-10-24 MED ORDER — NAPROXEN 500 MG PO TABS: ORAL_TABLET | ORAL | 0 refills | Status: DC

## 2016-10-24 NOTE — Patient Instructions (Signed)
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
## 2016-10-24 NOTE — Progress Notes (Signed)
Patient is here for left side back pain right arm pain & eye vision blurry   Patient had left eye cataract  took out

## 2016-10-27 ENCOUNTER — Ambulatory Visit (HOSPITAL_COMMUNITY)
Admission: RE | Admit: 2016-10-27 | Discharge: 2016-10-27 | Disposition: A | Discharge status: Home / Self Care | Payer: Self-pay | Source: Ambulatory Visit | Attending: Family Medicine | Admitting: Family Medicine

## 2016-10-27 DIAGNOSIS — M5442 Lumbago with sciatica, left side: Secondary | ICD-10-CM | POA: Insufficient documentation

## 2016-10-27 DIAGNOSIS — S39012A Strain of muscle, fascia and tendon of lower back, initial encounter: Secondary | ICD-10-CM

## 2016-10-27 DIAGNOSIS — M47896 Other spondylosis, lumbar region: Secondary | ICD-10-CM | POA: Insufficient documentation

## 2016-10-27 DIAGNOSIS — Z87828 Personal history of other (healed) physical injury and trauma: Secondary | ICD-10-CM | POA: Insufficient documentation

## 2016-11-01 ENCOUNTER — Other Ambulatory Visit: Payer: Self-pay | Admitting: Family Medicine

## 2016-11-01 ENCOUNTER — Telehealth: Payer: Self-pay

## 2016-11-01 DIAGNOSIS — M47816 Spondylosis without myelopathy or radiculopathy, lumbar region: Secondary | ICD-10-CM | POA: Insufficient documentation

## 2016-11-01 NOTE — Telephone Encounter (Signed)
-----   Message from Lizbeth Bark, FNP sent at 11/01/2016  8:54 AM EDT ----- Mild osteoarthritis to the lower spine. No evidence of fracture, normal spinal alignment.  You will be referred to PT.

## 2016-11-01 NOTE — Telephone Encounter (Signed)
CMA call regarding x ray results   Patient Verify DOB   Patient was aware and understood  

## 2016-11-15 ENCOUNTER — Ambulatory Visit (INDEPENDENT_AMBULATORY_CARE_PROVIDER_SITE_OTHER): Payer: Self-pay | Admitting: Orthopedic Surgery

## 2016-11-15 ENCOUNTER — Encounter (INDEPENDENT_AMBULATORY_CARE_PROVIDER_SITE_OTHER): Payer: Self-pay | Admitting: Orthopedic Surgery

## 2016-11-15 DIAGNOSIS — M5442 Lumbago with sciatica, left side: Secondary | ICD-10-CM

## 2016-11-15 MED ORDER — METHYLPREDNISOLONE 4 MG PO TABS: ORAL_TABLET | ORAL | 0 refills | Status: DC

## 2016-11-15 MED ORDER — BACLOFEN 10 MG PO TABS: 10.0000 mg | ORAL_TABLET | Freq: Two times a day (BID) | ORAL | 0 refills | Status: DC
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
## 2016-12-18 ENCOUNTER — Encounter (INDEPENDENT_AMBULATORY_CARE_PROVIDER_SITE_OTHER): Payer: Self-pay | Admitting: Orthopedic Surgery

## 2016-12-18 ENCOUNTER — Ambulatory Visit (INDEPENDENT_AMBULATORY_CARE_PROVIDER_SITE_OTHER): Payer: Self-pay | Admitting: Orthopedic Surgery

## 2016-12-18 DIAGNOSIS — M5442 Lumbago with sciatica, left side: Secondary | ICD-10-CM

## 2016-12-18 DIAGNOSIS — G8929 Other chronic pain: Secondary | ICD-10-CM
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
## 2017-01-02 ENCOUNTER — Ambulatory Visit
Admission: RE | Admit: 2017-01-02 | Discharge: 2017-01-02 | Disposition: A | Discharge status: Home / Self Care | Payer: No Typology Code available for payment source | Source: Ambulatory Visit | Attending: Orthopedic Surgery | Admitting: Orthopedic Surgery

## 2017-01-02 DIAGNOSIS — M5442 Lumbago with sciatica, left side: Principal | ICD-10-CM

## 2017-01-02 DIAGNOSIS — G8929 Other chronic pain: Secondary | ICD-10-CM

## 2017-01-10 ENCOUNTER — Encounter (INDEPENDENT_AMBULATORY_CARE_PROVIDER_SITE_OTHER): Payer: Self-pay | Admitting: Orthopedic Surgery

## 2017-01-10 ENCOUNTER — Ambulatory Visit (INDEPENDENT_AMBULATORY_CARE_PROVIDER_SITE_OTHER): Payer: Self-pay | Admitting: Orthopedic Surgery

## 2017-01-10 DIAGNOSIS — M5416 Radiculopathy, lumbar region: Secondary | ICD-10-CM
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
## 2017-01-15 ENCOUNTER — Ambulatory Visit: Payer: Self-pay

## 2017-01-15 ENCOUNTER — Ambulatory Visit: Payer: Self-pay | Attending: Family Medicine

## 2017-01-24 ENCOUNTER — Encounter (INDEPENDENT_AMBULATORY_CARE_PROVIDER_SITE_OTHER): Payer: Self-pay | Admitting: Physical Medicine and Rehabilitation

## 2017-01-31 ENCOUNTER — Encounter (INDEPENDENT_AMBULATORY_CARE_PROVIDER_SITE_OTHER): Payer: Self-pay | Admitting: Physical Medicine and Rehabilitation

## 2017-01-31 ENCOUNTER — Ambulatory Visit (INDEPENDENT_AMBULATORY_CARE_PROVIDER_SITE_OTHER): Payer: Self-pay | Admitting: Physical Medicine and Rehabilitation

## 2017-01-31 ENCOUNTER — Ambulatory Visit (INDEPENDENT_AMBULATORY_CARE_PROVIDER_SITE_OTHER): Payer: Self-pay

## 2017-01-31 VITALS — BP 112/69 | HR 49 | Ht 69.0 in | Wt 150.0 lb

## 2017-01-31 DIAGNOSIS — M5116 Intervertebral disc disorders with radiculopathy, lumbar region: Secondary | ICD-10-CM

## 2017-01-31 DIAGNOSIS — M5416 Radiculopathy, lumbar region: Secondary | ICD-10-CM

## 2017-01-31 MED ORDER — BETAMETHASONE SOD PHOS & ACET 6 (3-3) MG/ML IJ SUSP
12.0000 mg | Freq: Once | INTRAMUSCULAR | Status: AC
  Administered 2017-01-31: 12 mg

## 2017-01-31 MED ORDER — LIDOCAINE HCL (PF) 1 % IJ SOLN
2.0000 mL | Freq: Once | INTRAMUSCULAR | Status: AC
  Administered 2017-01-31: 2 mL

## 2017-01-31 NOTE — Patient Instructions (Signed)

## 2017-01-31 NOTE — Progress Notes (Deleted)
Patient presents with sharp left sided low back pain that radiates down his left leg to his foot.  The pain is alleviated somewhat if he lays on his side and bends his knees up. He does experience tingling down his left leg as well. He denies taking a blood thinner and has a driver with him today.

## 2017-02-15 NOTE — Procedures (Signed)
Mr. Frederick Roth is a 52 year old patient followed by Dr. August Saucerean who reports sharp left-sided low back pain that refers down his left leg to the medial portion of his foot.  He has a pretty classic L4 distribution to his pain.  He reports decreased symptoms if he lays on his side and bends his knee.  He has having paresthesias in the same distribution.  He denies any right-sided symptoms.  He has failed conservative care through Dr. August Saucerean an MRI of the lumbar spine was performed and this is reviewed below.  We are going to complete a left L4 transforaminal epidural steroid injection diagnostically and hopefully therapeutically.  The injection  will be diagnostic and hopefully therapeutic. The patient has failed conservative care including time, medications and activity modification.  Lumbosacral Transforaminal Epidural Steroid Injection - Sub-Pedicular Approach with Fluoroscopic Guidance  Patient: Frederick Roth      Date of Birth: [birth year] MRN: [id number] PCP: Lizbeth BarkHairston, Mandesia R, FNP      Visit Date: 01/31/2017   Universal Protocol:    Date/Time: 01/31/2017  Consent Given By: the patient  Position: PRONE  Additional Comments: Vital signs were monitored before and after the procedure. Patient was prepped and draped in the usual sterile fashion. The correct patient, procedure, and site was verified.   Injection Procedure Details:  Procedure Site One Meds Administered:  Meds ordered this encounter  Medications  . lidocaine (PF) (XYLOCAINE) 1 % injection 2 mL  . betamethasone acetate-betamethasone sodium phosphate (CELESTONE) injection 12 mg    Laterality: Left  Location/Site:  L4-L5  Needle size: 22 G  Needle type: Spinal  Needle Placement: Transforaminal  Findings:    -Comments: Excellent flow of contrast along the nerve and into the epidural space.  Procedure Details: After squaring off the end-plates to get a true AP view, the C-arm was positioned so that an oblique  view of the foramen as noted above was visualized. The target area is just inferior to the "nose of the scotty dog" or sub pedicular. The soft tissues overlying this structure were infiltrated with 2-3 ml. of 1% Lidocaine without Epinephrine.  The spinal needle was inserted toward the target using a "trajectory" view along the fluoroscope beam.  Under AP and lateral visualization, the needle was advanced so it did not puncture dura and was located close the 6 O'Clock position of the pedical in AP tracterory. Biplanar projections were used to confirm position. Aspiration was confirmed to be negative for CSF and/or blood. A 1-2 ml. volume of Isovue-250 was injected and flow of contrast was noted at each level. Radiographs were obtained for documentation purposes.   After attaining the desired flow of contrast documented above, a 0.5 to 1.0 ml test dose of 0.25% Marcaine was injected into each respective transforaminal space.  The patient was observed for 90 seconds post injection.  After no sensory deficits were reported, and normal lower extremity motor function was noted,   the above injectate was administered so that equal amounts of the injectate were placed at each foramen (level) into the transforaminal epidural space.   Additional Comments:  The patient tolerated the procedure well Dressing: Band-Aid    Post-procedure details: Patient was observed during the procedure. Post-procedure instructions were reviewed.  Patient left the clinic in stable condition.   Pertinent Imaging: MRI LUMBAR SPINE WITHOUT CONTRAST  TECHNIQUE: Multiplanar, multisequence MR imaging of the lumbar spine was performed. No intravenous contrast was administered.  COMPARISON:  Lumbar radiographs 10/27/2016  FINDINGS: Segmentation: Normal  lumbar segmentation as demonstrated on the radiographs.  Alignment: Preserved lumbar lordosis. No significant spondylolisthesis.  Vertebrae: Right eccentric superior  endplate degenerative appearing marrow edema at L3 (series 7, image 5). Similar anterior superior endplate marrow edema at L5 bilaterally. Background bone marrow signal is within normal limits. No other No acute osseous abnormality identified. Intact visible sacrum and SI joints.  Conus medullaris and cauda equina: Conus extends to the T12-L1 level. Conus and cauda equina appear normal.  Paraspinal and other soft tissues: Negative.  Disc levels:  T11-12:  Negative.  T12-L1:  Negative.  L1-L2:  Negative.  L2-L3: Minor circumferential disc bulge and endplate spurring. No stenosis.  L3-L4: Minor circumferential disc bulge and endplate spurring. No stenosis.  L4-L5: Mild circumferential disc bulge. Superimposed broad-based left foraminal disc protrusion best seen on series 10, images 22 and 23. Mild endplate spurring. Mild facet and ligament flavum hypertrophy. No spinal or lateral recess stenosis. Moderate left L4 neural foraminal stenosis.  L5-S1: Mild to moderate facet hypertrophy, greater on the right. Mild right L5 neural foraminal stenosis.  IMPRESSION: 1. The symptomatic level appears to be L4-L5 where a broad-based left foraminal disc herniation results in moderate stenosis. Correlate for left L4 radiculitis. 2. Mild for age lumbar spine degeneration elsewhere, most notably right side facet hypertrophy at L5-S1.   Electronically Signed   By: Odessa Fleming M.D.   On: 01/02/2017 11:08

## 2017-02-26 ENCOUNTER — Telehealth (INDEPENDENT_AMBULATORY_CARE_PROVIDER_SITE_OTHER): Payer: Self-pay | Admitting: Physical Medicine and Rehabilitation

## 2017-02-26 NOTE — Telephone Encounter (Signed)
Scheduled for 03/07/17 with driver.

## 2017-02-26 NOTE — Telephone Encounter (Signed)
Repeat x1 Left L4 TF esi

## 2017-03-07 ENCOUNTER — Ambulatory Visit (INDEPENDENT_AMBULATORY_CARE_PROVIDER_SITE_OTHER): Payer: Self-pay

## 2017-03-07 ENCOUNTER — Encounter (INDEPENDENT_AMBULATORY_CARE_PROVIDER_SITE_OTHER): Payer: Self-pay | Admitting: Physical Medicine and Rehabilitation

## 2017-03-07 ENCOUNTER — Ambulatory Visit (INDEPENDENT_AMBULATORY_CARE_PROVIDER_SITE_OTHER): Payer: Self-pay | Admitting: Physical Medicine and Rehabilitation

## 2017-03-07 VITALS — BP 113/68 | HR 57 | Temp 98.0°F

## 2017-03-07 DIAGNOSIS — M5416 Radiculopathy, lumbar region: Secondary | ICD-10-CM

## 2017-03-07 MED ORDER — BETAMETHASONE SOD PHOS & ACET 6 (3-3) MG/ML IJ SUSP
12.0000 mg | Freq: Once | INTRAMUSCULAR | Status: AC
  Administered 2017-03-07: 12 mg

## 2017-03-07 NOTE — Procedures (Signed)
Mr. Frederick Roth is a 52 year old gentleman with left L4-5 left foraminal herniated disc.  Patient had relief with prior left L4 transforaminal injection in December.  He got more than 50% relief but is had recent worsening.  We are going to repeat the injection today.  Lumbosacral Transforaminal Epidural Steroid Injection - Sub-Pedicular Approach with Fluoroscopic Guidance  Patient: Frederick Roth      Date of Birth: 12-01-1965 MRN: [id number] PCP: Lizbeth Bark, FNP      Visit Date: 03/07/2017   Universal Protocol:    Date/Time: 03/07/2017  Consent Given By: the patient  Position: PRONE  Additional Comments: Vital signs were monitored before and after the procedure. Patient was prepped and draped in the usual sterile fashion. The correct patient, procedure, and site was verified.   Injection Procedure Details:  Procedure Site One Meds Administered:  Meds ordered this encounter  Medications  . betamethasone acetate-betamethasone sodium phosphate (CELESTONE) injection 12 mg    Laterality: Left  Location/Site:  L4-L5  Needle size: 22 G  Needle type: Spinal  Needle Placement: Transforaminal  Findings:    -Comments: Excellent flow of contrast along the nerve and into the epidural space.  Procedure Details: After squaring off the end-plates to get a true AP view, the C-arm was positioned so that an oblique view of the foramen as noted above was visualized. The target area is just inferior to the "nose of the scotty dog" or sub pedicular. The soft tissues overlying this structure were infiltrated with 2-3 ml. of 1% Lidocaine without Epinephrine.  The spinal needle was inserted toward the target using a "trajectory" view along the fluoroscope beam.  Under AP and lateral visualization, the needle was advanced so it did not puncture dura and was located close the 6 O'Clock position of the pedical in AP tracterory. Biplanar projections were used to confirm position.  Aspiration was confirmed to be negative for CSF and/or blood. A 1-2 ml. volume of Isovue-250 was injected and flow of contrast was noted at each level. Radiographs were obtained for documentation purposes.   After attaining the desired flow of contrast documented above, a 0.5 to 1.0 ml test dose of 0.25% Marcaine was injected into each respective transforaminal space.  The patient was observed for 90 seconds post injection.  After no sensory deficits were reported, and normal lower extremity motor function was noted,   the above injectate was administered so that equal amounts of the injectate were placed at each foramen (level) into the transforaminal epidural space.   Additional Comments:  The patient tolerated the procedure well Dressing: Band-Aid    Post-procedure details: Patient was observed during the procedure. Post-procedure instructions were reviewed.  Patient left the clinic in stable condition.   Pertinent Imaging: MRI LUMBAR SPINE WITHOUT CONTRAST  TECHNIQUE: Multiplanar, multisequence MR imaging of the lumbar spine was performed. No intravenous contrast was administered.  COMPARISON:  Lumbar radiographs 10/27/2016  FINDINGS: Segmentation: Normal lumbar segmentation as demonstrated on the radiographs.  Alignment: Preserved lumbar lordosis. No significant spondylolisthesis.  Vertebrae: Right eccentric superior endplate degenerative appearing marrow edema at L3 (series 7, image 5). Similar anterior superior endplate marrow edema at L5 bilaterally. Background bone marrow signal is within normal limits. No other No acute osseous abnormality identified. Intact visible sacrum and SI joints.  Conus medullaris and cauda equina: Conus extends to the T12-L1 level. Conus and cauda equina appear normal.  Paraspinal and other soft tissues: Negative.  Disc levels:  T11-12:  Negative.  T12-L1:  Negative.  L1-L2:  Negative.  L2-L3: Minor circumferential  disc bulge and endplate spurring. No stenosis.  L3-L4: Minor circumferential disc bulge and endplate spurring. No stenosis.  L4-L5: Mild circumferential disc bulge. Superimposed broad-based left foraminal disc protrusion best seen on series 10, images 22 and 23. Mild endplate spurring. Mild facet and ligament flavum hypertrophy. No spinal or lateral recess stenosis. Moderate left L4 neural foraminal stenosis.  L5-S1: Mild to moderate facet hypertrophy, greater on the right. Mild right L5 neural foraminal stenosis.  IMPRESSION: 1. The symptomatic level appears to be L4-L5 where a broad-based left foraminal disc herniation results in moderate stenosis. Correlate for left L4 radiculitis. 2. Mild for age lumbar spine degeneration elsewhere, most notably right side facet hypertrophy at L5-S1.   Electronically Signed   By: Odessa FlemingH  Hall M.D.   On: 01/02/2017 11:08

## 2017-03-07 NOTE — Progress Notes (Deleted)
Pt states pain in lower back mostly on left side with sharp pain in his left thigh. Pt states pain has been going on for a week. Pt states moving around makes pain worse, nothing make it better. +Driver, -BT, -Dye Allergies.

## 2017-03-07 NOTE — Patient Instructions (Signed)

## 2017-04-18 ENCOUNTER — Ambulatory Visit: Payer: Self-pay

## 2017-04-27 ENCOUNTER — Ambulatory Visit: Payer: Self-pay | Attending: Family Medicine

## 2017-07-16 ENCOUNTER — Other Ambulatory Visit: Payer: Self-pay | Admitting: Internal Medicine

## 2017-07-17 ENCOUNTER — Encounter: Payer: Self-pay | Admitting: Internal Medicine

## 2017-07-17 ENCOUNTER — Ambulatory Visit: Payer: Self-pay | Attending: Internal Medicine | Admitting: Internal Medicine

## 2017-07-17 VITALS — BP 125/64 | HR 69 | Temp 98.5°F | Resp 16 | Ht 69.0 in | Wt 150.0 lb

## 2017-07-17 DIAGNOSIS — F1490 Cocaine use, unspecified, uncomplicated: Secondary | ICD-10-CM | POA: Insufficient documentation

## 2017-07-17 DIAGNOSIS — F17200 Nicotine dependence, unspecified, uncomplicated: Secondary | ICD-10-CM | POA: Insufficient documentation

## 2017-07-17 DIAGNOSIS — G5691 Unspecified mononeuropathy of right upper limb: Secondary | ICD-10-CM

## 2017-07-17 DIAGNOSIS — G629 Polyneuropathy, unspecified: Secondary | ICD-10-CM | POA: Insufficient documentation
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
## 2017-07-31 ENCOUNTER — Ambulatory Visit: Payer: Self-pay | Attending: Internal Medicine

## 2017-08-30 ENCOUNTER — Encounter: Payer: Self-pay | Admitting: Internal Medicine

## 2017-08-30 ENCOUNTER — Ambulatory Visit: Payer: Self-pay | Attending: Internal Medicine | Admitting: Internal Medicine

## 2017-08-30 VITALS — BP 119/79 | HR 50 | Temp 97.7°F | Resp 16 | Wt 153.4 lb

## 2017-08-30 DIAGNOSIS — F17200 Nicotine dependence, unspecified, uncomplicated: Secondary | ICD-10-CM | POA: Insufficient documentation

## 2017-08-30 DIAGNOSIS — G629 Polyneuropathy, unspecified: Secondary | ICD-10-CM | POA: Insufficient documentation

## 2017-08-30 DIAGNOSIS — M7918 Myalgia, other site: Secondary | ICD-10-CM | POA: Insufficient documentation

## 2017-08-30 DIAGNOSIS — G5691 Unspecified mononeuropathy of right upper limb: Secondary | ICD-10-CM

## 2017-08-30 MED ORDER — DICLOFENAC SODIUM 1 % TD GEL: 2.0000 g | Freq: Four times a day (QID) | TRANSDERMAL | 1 refills | Status: DC

## 2017-08-30 MED FILL — DICLOFENAC SODIUM 1% GEL: 1 | 12 days supply | Qty: 100 | Fill #0
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
## 2017-09-06 ENCOUNTER — Ambulatory Visit: Payer: Self-pay | Admitting: Internal Medicine

## 2017-10-03 ENCOUNTER — Ambulatory Visit (INDEPENDENT_AMBULATORY_CARE_PROVIDER_SITE_OTHER): Payer: Self-pay | Admitting: Neurology

## 2017-10-03 ENCOUNTER — Encounter: Payer: Self-pay | Admitting: Neurology

## 2017-10-03 DIAGNOSIS — M79601 Pain in right arm: Secondary | ICD-10-CM

## 2017-10-03 NOTE — Progress Notes (Signed)
[facility]    Nerve / Sites Muscle Latency Ref. Amplitude Ref. Rel Amp Segments Distance Velocity Ref. Area    ms ms mV mV %  cm m/s m/s mVms  R Median - APB     Wrist APB 3.3 ?4.4 5.6 ?4.0 100 Wrist - APB 7   16.0     Upper arm APB 7.8  5.0  88.9 Upper arm - Wrist 23 51 ?49 17.1  R Ulnar - ADM     Wrist ADM 3.1 ?3.3 7.0 ?6.0 100 Wrist - ADM 7   19.5     B.Elbow ADM 6.6  5.8  83.5 B.Elbow - Wrist 20 56 ?49 19.7     A.Elbow ADM 8.5  5.8  99.9 A.Elbow - B.Elbow 10 53 ?49 19.5         A.Elbow - Wrist             [facility]    Nerve / Sites Rec. Site Peak Lat Ref.  Amp Ref. Segments Distance    ms ms V V  cm  R Radial - Anatomical snuff box (Forearm)     Forearm Wrist 2.6 ?2.9 14 ?15 Forearm - Wrist 10  R Median - Orthodromic (Dig II, Mid palm)     Dig II Wrist 2.9 ?3.4 10 ?10 Dig II - Wrist 13  R Ulnar - Orthodromic, (Dig V, Mid palm)     Dig V Wrist 2.8 ?3.1 6 ?5 Dig V - Wrist 8111                      F  Wave    Nerve F Lat Ref.   ms ms  R Ulnar - ADM 29.3 ?32.0
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
## 2017-10-03 NOTE — Progress Notes (Signed)
Please refer to EMG and nerve conduction study procedure note. 

## 2017-10-29 ENCOUNTER — Ambulatory Visit: Payer: Self-pay | Attending: Internal Medicine | Admitting: Internal Medicine

## 2017-10-29 ENCOUNTER — Encounter: Payer: Self-pay | Admitting: Internal Medicine

## 2017-10-29 VITALS — BP 113/76 | HR 50 | Temp 98.3°F | Resp 16 | Wt 158.0 lb

## 2017-10-29 DIAGNOSIS — F17210 Nicotine dependence, cigarettes, uncomplicated: Secondary | ICD-10-CM | POA: Insufficient documentation

## 2017-10-29 DIAGNOSIS — R5383 Other fatigue: Secondary | ICD-10-CM | POA: Insufficient documentation

## 2017-10-29 DIAGNOSIS — F17200 Nicotine dependence, unspecified, uncomplicated: Secondary | ICD-10-CM | POA: Insufficient documentation

## 2017-10-29 DIAGNOSIS — R200 Anesthesia of skin: Secondary | ICD-10-CM | POA: Insufficient documentation

## 2017-10-29 DIAGNOSIS — K625 Hemorrhage of anus and rectum: Secondary | ICD-10-CM | POA: Insufficient documentation

## 2017-10-29 DIAGNOSIS — Z131 Encounter for screening for diabetes mellitus: Secondary | ICD-10-CM | POA: Insufficient documentation

## 2017-10-29 DIAGNOSIS — R29898 Other symptoms and signs involving the musculoskeletal system: Secondary | ICD-10-CM

## 2017-10-29 DIAGNOSIS — Z800 Family history of malignant neoplasm of digestive organs: Secondary | ICD-10-CM | POA: Insufficient documentation

## 2017-10-29 DIAGNOSIS — Z125 Encounter for screening for malignant neoplasm of prostate: Secondary | ICD-10-CM | POA: Insufficient documentation

## 2017-10-29 DIAGNOSIS — M47816 Spondylosis without myelopathy or radiculopathy, lumbar region: Secondary | ICD-10-CM | POA: Insufficient documentation

## 2017-10-29 MED ORDER — VARENICLINE TARTRATE 1 MG PO TABS
1.0000 mg | ORAL_TABLET | Freq: Two times a day (BID) | ORAL | 1 refills | Status: DC
Start: 2017-10-29 — End: 2019-03-30

## 2017-10-29 MED ORDER — VARENICLINE TARTRATE 0.5 MG X 11 & 1 MG X 42 PO MISC: ORAL | 0 refills | Status: DC
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
## 2017-10-29 NOTE — Patient Instructions (Signed)
You have been referred to the gastroenterologist for further evaluation of the rectal bleeding.  He will need to have a colonoscopy.  Start Chantix to help with quitting smoking.  Chantix sometimes can cause bad dreams and mood swings.  Let me know if you have any side effects such as these.

## 2017-10-30 LAB — LIPID PANEL
CHOLESTEROL TOTAL: 183 mg/dL (ref 100–199)
Chol/HDL Ratio: 3.6 ratio (ref 0.0–5.0)
HDL: 51 mg/dL (ref >39)
LDL Calculated: 99 mg/dL (ref 0–99)
Triglycerides: 163 mg/dL — ABNORMAL HIGH (ref 0–149)
VLDL CHOLESTEROL CAL: 33 mg/dL (ref 5–40)

## 2017-10-30 LAB — COMPREHENSIVE METABOLIC PANEL
ALT: 20 IU/L (ref 0–44)
AST: 20 IU/L (ref 0–40)
Albumin/Globulin Ratio: 1.6 (ref 1.2–2.2)
Albumin: 4.2 g/dL (ref 3.5–5.5)
Alkaline Phosphatase: 69 IU/L (ref 39–117)
BUN/Creatinine Ratio: 10 (ref 9–20)
BUN: 12 mg/dL (ref 6–24)
Bilirubin Total: 0.5 mg/dL (ref 0.0–1.2)
CHLORIDE: 103 mmol/L (ref 96–106)
CO2: 21 mmol/L (ref 20–29)
Calcium: 9.6 mg/dL (ref 8.7–10.2)
Creatinine, Ser: 1.2 mg/dL (ref 0.76–1.27)
GFR, EST AFRICAN AMERICAN: 79 mL/min/{1.73_m2} (ref >59)
GFR, EST NON AFRICAN AMERICAN: 68 mL/min/{1.73_m2} (ref >59)
GLUCOSE: 101 mg/dL — AB (ref 65–99)
Globulin, Total: 2.7 g/dL (ref 1.5–4.5)
Potassium: 4.3 mmol/L (ref 3.5–5.2)
Sodium: 138 mmol/L (ref 134–144)
TOTAL PROTEIN: 6.9 g/dL (ref 6.0–8.5)

## 2017-10-30 LAB — CBC
HEMATOCRIT: 45 % (ref 37.5–51.0)
HEMOGLOBIN: 15.7 g/dL (ref 13.0–17.7)
MCH: 31.1 pg (ref 26.6–33.0)
MCHC: 34.9 g/dL (ref 31.5–35.7)
MCV: 89 fL (ref 79–97)
Platelets: 298 10*3/uL (ref 150–450)
RBC: 5.05 x10E6/uL (ref 4.14–5.80)
RDW: 12.4 % (ref 12.3–15.4)
WBC: 8.1 10*3/uL (ref 3.4–10.8)

## 2017-10-30 LAB — HEMOGLOBIN A1C
ESTIMATED AVERAGE GLUCOSE: 117 mg/dL
Hgb A1c MFr Bld: 5.7 % — ABNORMAL HIGH (ref 4.8–5.6)

## 2017-10-30 LAB — PSA: Prostate Specific Ag, Serum: 2.8 ng/mL (ref 0.0–4.0)

## 2017-10-31 ENCOUNTER — Telehealth: Payer: Self-pay

## 2017-10-31 NOTE — Telephone Encounter (Signed)
Contacted pt to go over lab results pt is aware and doesn't have any questions or concerns 

## 2017-11-06 ENCOUNTER — Encounter: Payer: Self-pay | Admitting: Gastroenterology

## 2017-12-31 ENCOUNTER — Ambulatory Visit: Payer: Self-pay | Attending: Internal Medicine | Admitting: Internal Medicine

## 2017-12-31 ENCOUNTER — Encounter: Payer: Self-pay | Admitting: Internal Medicine

## 2017-12-31 VITALS — BP 110/66 | HR 51 | Temp 97.6°F | Resp 16 | Wt 152.6 lb

## 2017-12-31 DIAGNOSIS — F17200 Nicotine dependence, unspecified, uncomplicated: Secondary | ICD-10-CM | POA: Insufficient documentation

## 2017-12-31 DIAGNOSIS — Z833 Family history of diabetes mellitus: Secondary | ICD-10-CM | POA: Insufficient documentation

## 2017-12-31 DIAGNOSIS — K625 Hemorrhage of anus and rectum: Secondary | ICD-10-CM | POA: Insufficient documentation

## 2017-12-31 DIAGNOSIS — R7303 Prediabetes: Secondary | ICD-10-CM | POA: Insufficient documentation

## 2017-12-31 DIAGNOSIS — Z9889 Other specified postprocedural states: Secondary | ICD-10-CM | POA: Insufficient documentation

## 2017-12-31 DIAGNOSIS — Z79899 Other long term (current) drug therapy: Secondary | ICD-10-CM | POA: Insufficient documentation

## 2017-12-31 MED FILL — $CHANTIX 1 MG CONT MONTH BO: 1 | 56 days supply | Qty: 112 | Fill #0

## 2017-12-31 MED FILL — $CHANTIX STARTING MONTH BOX: 0.5 MG X 11 | 28 days supply | Qty: 53 | Fill #0

## 2017-12-31 NOTE — Patient Instructions (Signed)
Prediabetes Prediabetes is the condition of having a blood sugar (blood glucose) level that is higher than it should be, but not high enough for you to be diagnosed with type 2 diabetes. Having prediabetes puts you at risk for developing type 2 diabetes (type 2 diabetes mellitus). Prediabetes may be called impaired glucose tolerance or impaired fasting glucose. Prediabetes usually does not cause symptoms. Your health care provider can diagnose this condition with blood tests. You may be tested for prediabetes if you are overweight and if you have at least one other risk factor for prediabetes. Risk factors for prediabetes include:  Having a family member with type 2 diabetes.  Being overweight or obese.  Being older than age 45.  Being of American-Indian, African-American, Hispanic/Latino, or Asian/Pacific Islander descent.  Having an inactive (sedentary) lifestyle.  Having a history of gestational diabetes or polycystic ovarian syndrome (PCOS).  Having low levels of good cholesterol (HDL-C) or high levels of blood fats (triglycerides).  Having high blood pressure.  What is blood glucose and how is blood glucose measured?  Blood glucose refers to the amount of glucose in your bloodstream. Glucose comes from eating foods that contain sugars and starches (carbohydrates) that the body breaks down into glucose. Your blood glucose level may be measured in mg/dL (milligrams per deciliter) or mmol/L (millimoles per liter).Your blood glucose may be checked with one or more of the following blood tests:  A fasting blood glucose (FBG) test. You will not be allowed to eat (you will fast) for at least 8 hours before a blood sample is taken. ? A normal range for FBG is 70-100 mg/dl (3.9-5.6 mmol/L).  An A1c (hemoglobin A1c) blood test. This test provides information about blood glucose control over the previous 2?3months.  An oral glucose tolerance test (OGTT). This test measures your blood  glucose twice: ? After fasting. This is your baseline level. ? Two hours after you drink a beverage that contains glucose.  You may be diagnosed with prediabetes:  If your FBG is 100?125 mg/dL (5.6-6.9 mmol/L).  If your A1c level is 5.7?6.4%.  If your OGGT result is 140?199 mg/dL (7.8-11 mmol/L).  These blood tests may be repeated to confirm your diagnosis. What happens if blood glucose is too high? The pancreas produces a hormone (insulin) that helps move glucose from the bloodstream into cells. When cells in the body do not respond properly to insulin that the body makes (insulin resistance), excess glucose builds up in the blood instead of going into cells. As a result, high blood glucose (hyperglycemia) can develop, which can cause many complications. This is a symptom of prediabetes. What can happen if blood glucose stays higher than normal for a long time? Having high blood glucose for a long time is dangerous. Too much glucose in your blood can damage your nerves and blood vessels. Long-term damage can lead to complications from diabetes, which may include:  Heart disease.  Stroke.  Blindness.  Kidney disease.  Depression.  Poor circulation in the feet and legs, which could lead to surgical removal (amputation) in severe cases.  How can prediabetes be prevented from turning into type 2 diabetes?  To help prevent type 2 diabetes, take the following actions:  Be physically active. ? Do moderate-intensity physical activity for at least 30 minutes on at least 5 days of the week, or as much as told by your health care provider. This could be brisk walking, biking, or water aerobics. ? Ask your health care provider what   activities are safe for you. A mix of physical activities may be best, such as walking, swimming, cycling, and strength training.  Lose weight as told by your health care provider. ? Losing 5-7% of your body weight can reverse insulin resistance. ? Your health  care provider can determine how much weight loss is best for you and can help you lose weight safely.  Follow a healthy meal plan. This includes eating lean proteins, complex carbohydrates, fresh fruits and vegetables, low-fat dairy products, and healthy fats. ? Follow instructions from your health care provider about eating or drinking restrictions. ? Make an appointment to see a diet and nutrition specialist (registered dietitian) to help you create a healthy eating plan that is right for you.  Do not smoke or use any tobacco products, such as cigarettes, chewing tobacco, and e-cigarettes. If you need help quitting, ask your health care provider.  Take over-the-counter and prescription medicines as told by your health care provider. You may be prescribed medicines that help lower the risk of type 2 diabetes.  This information is not intended to replace advice given to you by your health care provider. Make sure you discuss any questions you have with your health care provider. Document Released: 05/24/2015 Document Revised: 07/08/2015 Document Reviewed: 03/23/2015 Elsevier Interactive Patient Education  2018 Elsevier Inc.  
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
## 2017-12-31 NOTE — Progress Notes (Signed)
Pt has questions about the chantix

## 2018-01-30 ENCOUNTER — Ambulatory Visit (INDEPENDENT_AMBULATORY_CARE_PROVIDER_SITE_OTHER): Payer: Self-pay | Admitting: Orthopedic Surgery

## 2018-01-30 ENCOUNTER — Encounter (INDEPENDENT_AMBULATORY_CARE_PROVIDER_SITE_OTHER): Payer: Self-pay | Admitting: Orthopedic Surgery

## 2018-01-30 DIAGNOSIS — M5416 Radiculopathy, lumbar region: Secondary | ICD-10-CM

## 2018-01-31 ENCOUNTER — Ambulatory Visit: Payer: Self-pay | Attending: Internal Medicine

## 2018-02-01 ENCOUNTER — Other Ambulatory Visit (INDEPENDENT_AMBULATORY_CARE_PROVIDER_SITE_OTHER): Payer: Self-pay | Admitting: Orthopedic Surgery

## 2018-02-01 DIAGNOSIS — M5416 Radiculopathy, lumbar region: Secondary | ICD-10-CM

## 2018-02-01 MED ORDER — METHOCARBAMOL 500 MG PO TABS: 500.0000 mg | ORAL_TABLET | Freq: Three times a day (TID) | ORAL | 0 refills | Status: DC | PRN

## 2018-02-01 NOTE — Telephone Encounter (Signed)
I called patient and advised. Robaxin sent to pharmacy and ESI entered.

## 2018-02-01 NOTE — Telephone Encounter (Signed)
Medication to be sent in was robaxin 500mg  1 po q 8 prn spasms #30 with no refills.  Also refer to Dr. Alvester MorinNewton for Lumbar ESI

## 2018-02-01 NOTE — Telephone Encounter (Signed)
Can you please clarify which medication you were calling in for patient? I do not see where it was entered into the system.

## 2018-02-01 NOTE — Telephone Encounter (Signed)
Patient called advised  Rx was suppose to be called into [facility] on 01/30/18. Patient checked with the pharmacy and Rx is not there. The number to contact patient is [phone]

## 2018-02-03 ENCOUNTER — Encounter (INDEPENDENT_AMBULATORY_CARE_PROVIDER_SITE_OTHER): Payer: Self-pay | Admitting: Orthopedic Surgery
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
## 2018-02-03 NOTE — Telephone Encounter (Signed)
tyvm

## 2018-02-12 ENCOUNTER — Telehealth: Payer: Self-pay | Admitting: Gastroenterology

## 2018-02-12 ENCOUNTER — Ambulatory Visit: Payer: Self-pay | Admitting: Gastroenterology

## 2018-02-12 ENCOUNTER — Encounter: Payer: Self-pay | Admitting: Gastroenterology

## 2018-02-12 NOTE — Telephone Encounter (Signed)
PATIENT WAS A NO SHOW AND LETTER SENT  °

## 2018-02-21 ENCOUNTER — Telehealth (INDEPENDENT_AMBULATORY_CARE_PROVIDER_SITE_OTHER): Payer: Self-pay | Admitting: Physical Medicine and Rehabilitation

## 2018-02-21 NOTE — Telephone Encounter (Signed)
Patient's daughter called stating that the patient is not feeling well and wanted to r/s this appointment.  I cancelled the appointment but advised her that I could not r/s that I would send a message to have the appointment r/s.  CB#[phone].  Thank you.

## 2018-02-21 NOTE — Telephone Encounter (Signed)
Pt is r/s for 03/11/2018 with driver.

## 2018-02-21 NOTE — Telephone Encounter (Signed)
Can you call patient to reschedule? 

## 2018-02-22 ENCOUNTER — Encounter (INDEPENDENT_AMBULATORY_CARE_PROVIDER_SITE_OTHER): Payer: Self-pay | Admitting: Physical Medicine and Rehabilitation

## 2018-03-11 ENCOUNTER — Encounter (INDEPENDENT_AMBULATORY_CARE_PROVIDER_SITE_OTHER): Payer: Self-pay | Admitting: Physical Medicine and Rehabilitation

## 2018-07-04 ENCOUNTER — Encounter: Payer: Self-pay | Admitting: Physical Medicine and Rehabilitation

## 2018-07-04 ENCOUNTER — Other Ambulatory Visit: Payer: Self-pay

## 2018-07-04 ENCOUNTER — Ambulatory Visit (INDEPENDENT_AMBULATORY_CARE_PROVIDER_SITE_OTHER): Payer: Self-pay | Admitting: Physical Medicine and Rehabilitation

## 2018-07-04 ENCOUNTER — Ambulatory Visit: Payer: Self-pay

## 2018-07-04 VITALS — BP 125/82 | HR 70

## 2018-07-04 DIAGNOSIS — M5416 Radiculopathy, lumbar region: Secondary | ICD-10-CM

## 2018-07-04 MED ORDER — DEXAMETHASONE SODIUM PHOSPHATE 10 MG/ML IJ SOLN: 15.0000 mg | Freq: Once | INTRAMUSCULAR | Status: DC

## 2018-07-04 NOTE — Progress Notes (Signed)
 .  Numeric Pain Rating Scale and Functional Assessment Average Pain 6   In the last MONTH (on 0-10 scale) has pain interfered with the following?  1. General activity like being  able to carry out your everyday physical activities such as walking, climbing stairs, carrying groceries, or moving a chair?  Rating(7)   +Driver, -BT, -Dye Allergies.  

## 2018-07-13 ENCOUNTER — Telehealth: Payer: Self-pay

## 2018-07-13 NOTE — Telephone Encounter (Signed)
Called patient about potential exposure to COVID-19. No answer and unable to leave a voicemail.

## 2018-07-14 ENCOUNTER — Telehealth: Payer: Self-pay | Admitting: Adult Health

## 2018-07-14 NOTE — Telephone Encounter (Signed)
LEft voicemail for patient to call back regarding recent visit to [facility]

## 2018-07-30 NOTE — Procedures (Signed)
Lumbosacral Transforaminal Epidural Steroid Injection - Sub-Pedicular Approach with Fluoroscopic Guidance  Patient: Frederick Roth      Date of Birth: [birth year] MRN: [id number] PCP: Ladell Pier, MD      Visit Date: 07/04/2018   Universal Protocol:    Date/Time: 07/04/2018  Consent Given By: the patient  Position: PRONE  Additional Comments: Vital signs were monitored before and after the procedure. Patient was prepped and draped in the usual sterile fashion. The correct patient, procedure, and site was verified.   Injection Procedure Details:  Procedure Site One Meds Administered:  Meds ordered this encounter  Medications  . dexamethasone (DECADRON) injection 15 mg    Laterality: Left  Location/Site:  L4-L5  Needle size: 22 G  Needle type: Spinal  Needle Placement: Transforaminal  Findings:    -Comments: Excellent flow of contrast along the nerve and into the epidural space.  Procedure Details: After squaring off the end-plates to get a true AP view, the C-arm was positioned so that an oblique view of the foramen as noted above was visualized. The target area is just inferior to the "nose of the scotty dog" or sub pedicular. The soft tissues overlying this structure were infiltrated with 2-3 ml. of 1% Lidocaine without Epinephrine.  The spinal needle was inserted toward the target using a "trajectory" view along the fluoroscope beam.  Under AP and lateral visualization, the needle was advanced so it did not puncture dura and was located close the 6 O'Clock position of the pedical in AP tracterory. Biplanar projections were used to confirm position. Aspiration was confirmed to be negative for CSF and/or blood. A 1-2 ml. volume of Isovue-250 was injected and flow of contrast was noted at each level. Radiographs were obtained for documentation purposes.   After attaining the desired flow of contrast documented above, a 0.5 to 1.0 ml test dose of 0.25% Marcaine  was injected into each respective transforaminal space.  The patient was observed for 90 seconds post injection.  After no sensory deficits were reported, and normal lower extremity motor function was noted,   the above injectate was administered so that equal amounts of the injectate were placed at each foramen (level) into the transforaminal epidural space.   Additional Comments:  The patient tolerated the procedure well Dressing: 2 x 2 sterile gauze and Band-Aid    Post-procedure details: Patient was observed during the procedure. Post-procedure instructions were reviewed.  Patient left the clinic in stable condition.

## 2018-07-30 NOTE — Progress Notes (Signed)
Frederick MullRobin K Madl - 53 y.o. male MRN [id number]  Date of birth: 19-Feb-1965  Office Visit Note: Visit Date: 07/04/2018 PCP: Marcine MatarJohnson, Deborah B, MD Referred by: Marcine MatarJohnson, Deborah B, MD  Subjective: Chief Complaint  Patient presents with  . Lower Back - Pain  . Left Hip - Pain  . Left Leg - Pain   HPI:  Frederick Roth is a 53 y.o. male who comes in today At the request of Dr. Doneen Poissonhristopher Blackman for planned left L4 transforaminal epidural steroid injection.  Patient has disc protrusion impacting the foramen as well as moderate stenosis.  He is failed conservative care otherwise and is having severe radicular pain.  ROS Otherwise per HPI.  Assessment & Plan: Visit Diagnoses:  1. Lumbar radiculopathy     Plan: No additional findings.   Meds & Orders:  Meds ordered this encounter  Medications  . dexamethasone (DECADRON) injection 15 mg    Orders Placed This Encounter  Procedures  . XR C-ARM NO REPORT  . Epidural Steroid injection    Follow-up: No follow-ups on file.   Procedures: No procedures performed  Lumbosacral Transforaminal Epidural Steroid Injection - Sub-Pedicular Approach with Fluoroscopic Guidance  Patient: Frederick Roth      Date of Birth: 19-Feb-1965 MRN: [id number] PCP: Marcine MatarJohnson, Deborah B, MD      Visit Date: 07/04/2018   Universal Protocol:    Date/Time: 07/04/2018  Consent Given By: the patient  Position: PRONE  Additional Comments: Vital signs were monitored before and after the procedure. Patient was prepped and draped in the usual sterile fashion. The correct patient, procedure, and site was verified.   Injection Procedure Details:  Procedure Site One Meds Administered:  Meds ordered this encounter  Medications  . dexamethasone (DECADRON) injection 15 mg    Laterality: Left  Location/Site:  L4-L5  Needle size: 22 G  Needle type: Spinal  Needle Placement: Transforaminal  Findings:    -Comments: Excellent flow of  contrast along the nerve and into the epidural space.  Procedure Details: After squaring off the end-plates to get a true AP view, the C-arm was positioned so that an oblique view of the foramen as noted above was visualized. The target area is just inferior to the "nose of the scotty dog" or sub pedicular. The soft tissues overlying this structure were infiltrated with 2-3 ml. of 1% Lidocaine without Epinephrine.  The spinal needle was inserted toward the target using a "trajectory" view along the fluoroscope beam.  Under AP and lateral visualization, the needle was advanced so it did not puncture dura and was located close the 6 O'Clock position of the pedical in AP tracterory. Biplanar projections were used to confirm position. Aspiration was confirmed to be negative for CSF and/or blood. A 1-2 ml. volume of Isovue-250 was injected and flow of contrast was noted at each level. Radiographs were obtained for documentation purposes.   After attaining the desired flow of contrast documented above, a 0.5 to 1.0 ml test dose of 0.25% Marcaine was injected into each respective transforaminal space.  The patient was observed for 90 seconds post injection.  After no sensory deficits were reported, and normal lower extremity motor function was noted,   the above injectate was administered so that equal amounts of the injectate were placed at each foramen (level) into the transforaminal epidural space.   Additional Comments:  The patient tolerated the procedure well Dressing: 2 x 2 sterile gauze and Band-Aid    Post-procedure details: Patient  was observed during the procedure. Post-procedure instructions were reviewed.  Patient left the clinic in stable condition.    Clinical History: MRI LUMBAR SPINE WITHOUT CONTRAST  TECHNIQUE: Multiplanar, multisequence MR imaging of the lumbar spine was performed. No intravenous contrast was administered.  COMPARISON:  Lumbar radiographs 10/27/2016   FINDINGS: Segmentation: Normal lumbar segmentation as demonstrated on the radiographs.  Alignment: Preserved lumbar lordosis. No significant spondylolisthesis.  Vertebrae: Right eccentric superior endplate degenerative appearing marrow edema at L3 (series 7, image 5). Similar anterior superior endplate marrow edema at L5 bilaterally. Background bone marrow signal is within normal limits. No other No acute osseous abnormality identified. Intact visible sacrum and SI joints.  Conus medullaris and cauda equina: Conus extends to the T12-L1 level. Conus and cauda equina appear normal.  Paraspinal and other soft tissues: Negative.  Disc levels:  T11-12:  Negative.  T12-L1:  Negative.  L1-L2:  Negative.  L2-L3: Minor circumferential disc bulge and endplate spurring. No stenosis.  L3-L4: Minor circumferential disc bulge and endplate spurring. No stenosis.  L4-L5: Mild circumferential disc bulge. Superimposed broad-based left foraminal disc protrusion best seen on series 10, images 22 and 23. Mild endplate spurring. Mild facet and ligament flavum hypertrophy. No spinal or lateral recess stenosis. Moderate left L4 neural foraminal stenosis.  L5-S1: Mild to moderate facet hypertrophy, greater on the right. Mild right L5 neural foraminal stenosis.  IMPRESSION: 1. The symptomatic level appears to be L4-L5 where a broad-based left foraminal disc herniation results in moderate stenosis. Correlate for left L4 radiculitis. 2. Mild for age lumbar spine degeneration elsewhere, most notably right side facet hypertrophy at L5-S1.   Electronically Signed   By: Genevie Ann M.D.   On: 01/02/2017 11:08     Objective:  VS:  HT:    WT:   BMI:     BP:125/82  HR:70bpm  TEMP: ( )  RESP:96 % Physical Exam  Ortho Exam Imaging: No results found.

## 2018-08-02 ENCOUNTER — Telehealth: Payer: Self-pay | Admitting: Internal Medicine

## 2018-08-02 NOTE — Telephone Encounter (Signed)
Pt was returned his CAFA application since missing, signature, as well other documents like non filling letter, ID, bank statement, letter of support,

## 2019-01-09 ENCOUNTER — Emergency Department (HOSPITAL_COMMUNITY)
Admission: EM | Admit: 2019-01-09 | Discharge: 2019-01-09 | Disposition: A | Discharge status: Home / Self Care | Payer: BC Managed Care – PPO | Attending: Emergency Medicine | Admitting: Emergency Medicine

## 2019-01-09 ENCOUNTER — Emergency Department (HOSPITAL_COMMUNITY): Payer: BC Managed Care – PPO

## 2019-01-09 ENCOUNTER — Encounter (HOSPITAL_COMMUNITY): Payer: Self-pay

## 2019-01-09 ENCOUNTER — Other Ambulatory Visit: Payer: Self-pay

## 2019-01-09 DIAGNOSIS — F17200 Nicotine dependence, unspecified, uncomplicated: Secondary | ICD-10-CM | POA: Insufficient documentation

## 2019-01-09 DIAGNOSIS — M79605 Pain in left leg: Secondary | ICD-10-CM | POA: Diagnosis not present

## 2019-01-09 DIAGNOSIS — I891 Lymphangitis: Secondary | ICD-10-CM

## 2019-01-09 DIAGNOSIS — L03324 Acute lymphangitis of groin: Secondary | ICD-10-CM | POA: Insufficient documentation

## 2019-01-09 DIAGNOSIS — R05 Cough: Secondary | ICD-10-CM | POA: Insufficient documentation

## 2019-01-09 DIAGNOSIS — R1030 Lower abdominal pain, unspecified: Secondary | ICD-10-CM | POA: Diagnosis present

## 2019-01-09 LAB — CBC WITH DIFFERENTIAL/PLATELET
Abs Immature Granulocytes: 0.04 10*3/uL (ref 0.00–0.07)
Basophils Absolute: 0.1 10*3/uL (ref 0.0–0.1)
Basophils Relative: 1 %
Eosinophils Absolute: 0.2 10*3/uL (ref 0.0–0.5)
Eosinophils Relative: 2 %
HCT: 43.3 % (ref 39.0–52.0)
Hemoglobin: 14.3 g/dL (ref 13.0–17.0)
Immature Granulocytes: 0 %
Lymphocytes Relative: 10 %
Lymphs Abs: 1 10*3/uL (ref 0.7–4.0)
MCH: 31.4 pg (ref 26.0–34.0)
MCHC: 33 g/dL (ref 30.0–36.0)
MCV: 95 fL (ref 80.0–100.0)
Monocytes Absolute: 1.2 10*3/uL — ABNORMAL HIGH (ref 0.1–1.0)
Monocytes Relative: 13 %
Neutro Abs: 6.8 10*3/uL (ref 1.7–7.7)
Neutrophils Relative %: 74 %
Platelets: 271 10*3/uL (ref 150–400)
RBC: 4.56 MIL/uL (ref 4.22–5.81)
RDW: 13.2 % (ref 11.5–15.5)
WBC: 9.3 10*3/uL (ref 4.0–10.5)
nRBC: 0 % (ref 0.0–0.2)

## 2019-01-09 LAB — COMPREHENSIVE METABOLIC PANEL
ALT: 16 U/L (ref 0–44)
AST: 18 U/L (ref 15–41)
Albumin: 4 g/dL (ref 3.5–5.0)
Alkaline Phosphatase: 66 U/L (ref 38–126)
Anion gap: 9 (ref 5–15)
BUN: 13 mg/dL (ref 6–20)
CO2: 22 mmol/L (ref 22–32)
Calcium: 9 mg/dL (ref 8.9–10.3)
Chloride: 102 mmol/L (ref 98–111)
Creatinine, Ser: 1.14 mg/dL (ref 0.61–1.24)
GFR calc Af Amer: >60 mL/min (ref >60)
GFR calc non Af Amer: >60 mL/min (ref >60)
Glucose, Bld: 108 mg/dL — ABNORMAL HIGH (ref 70–99)
Potassium: 3.8 mmol/L (ref 3.5–5.1)
Sodium: 133 mmol/L — ABNORMAL LOW (ref 135–145)
Total Bilirubin: 1.3 mg/dL — ABNORMAL HIGH (ref 0.3–1.2)
Total Protein: 7.9 g/dL (ref 6.5–8.1)

## 2019-01-09 LAB — LACTIC ACID, PLASMA
Lactic Acid, Venous: 1 mmol/L (ref 0.5–1.9)
Lactic Acid, Venous: 1.5 mmol/L (ref 0.5–1.9)

## 2019-01-09 MED ORDER — SODIUM CHLORIDE 0.9 % IV SOLN
INTRAVENOUS | Status: DC
  Administered 2019-01-09: 11:00:00 via INTRAVENOUS

## 2019-01-09 MED ORDER — SODIUM CHLORIDE 0.9 % IV BOLUS
1000.0000 mL | Freq: Once | INTRAVENOUS | Status: AC
  Administered 2019-01-09: 1000 mL via INTRAVENOUS

## 2019-01-09 MED ORDER — FENTANYL CITRATE (PF) 100 MCG/2ML IJ SOLN
100.0000 ug | INTRAMUSCULAR | Status: DC | PRN
  Administered 2019-01-09: 100 ug via INTRAVENOUS
  Filled 2019-01-09: qty 2

## 2019-01-09 MED ORDER — ONDANSETRON HCL 4 MG/2ML IJ SOLN
4.0000 mg | Freq: Once | INTRAMUSCULAR | Status: AC
  Administered 2019-01-09: 4 mg via INTRAVENOUS
  Filled 2019-01-09: qty 2

## 2019-01-09 MED ORDER — CLINDAMYCIN HCL 300 MG PO CAPS: 300.0000 mg | ORAL_CAPSULE | Freq: Four times a day (QID) | ORAL | 0 refills | Status: DC
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
## 2019-01-09 NOTE — Discharge Instructions (Signed)
You appear to have a lymph gland and lymph vessel infection.  We are prescribing an antibiotic, clindamycin to treat this.  Use heat on the sore areas 3-4 times a day and elevate your left leg above your heart is much as possible.  Follow-up with your primary care doctor for further care and treatment as soon as possible.  Return here, if needed, for problems.

## 2019-01-09 NOTE — ED Notes (Signed)
Pt states he is unable to stay for admission at this time as he has plans for Thanksgiving. MD Eulis Foster made aware.

## 2019-01-09 NOTE — ED Triage Notes (Signed)
Pt reports he thinks he had an insect bite on left foot 4 days ago.  Says now left leg is swollen and reports a knot to upper thigh since yesterday.

## 2019-01-14 ENCOUNTER — Encounter (HOSPITAL_COMMUNITY): Payer: Self-pay | Admitting: Emergency Medicine

## 2019-01-14 ENCOUNTER — Emergency Department (HOSPITAL_COMMUNITY)
Admission: EM | Admit: 2019-01-14 | Discharge: 2019-01-14 | Disposition: A | Discharge status: Home / Self Care | Payer: BC Managed Care – PPO | Attending: Emergency Medicine | Admitting: Emergency Medicine

## 2019-01-14 ENCOUNTER — Other Ambulatory Visit: Payer: Self-pay

## 2019-01-14 DIAGNOSIS — F1410 Cocaine abuse, uncomplicated: Secondary | ICD-10-CM | POA: Diagnosis not present

## 2019-01-14 DIAGNOSIS — Z5189 Encounter for other specified aftercare: Secondary | ICD-10-CM | POA: Insufficient documentation

## 2019-01-14 DIAGNOSIS — F17210 Nicotine dependence, cigarettes, uncomplicated: Secondary | ICD-10-CM | POA: Insufficient documentation

## 2019-01-14 DIAGNOSIS — Z4800 Encounter for change or removal of nonsurgical wound dressing: Secondary | ICD-10-CM | POA: Diagnosis present

## 2019-01-14 MED ORDER — BACITRACIN-NEOMYCIN-POLYMYXIN 400-5-5000 EX OINT
TOPICAL_OINTMENT | Freq: Once | CUTANEOUS | Status: DC
  Filled 2019-01-14: qty 2

## 2019-01-14 NOTE — ED Triage Notes (Signed)
PT reports he was seen in the ED this past Friday and was started on antibiotics for possible spider bite to left foot. PT states he wants wound rechecked. Blistered open area noted in between 3rd and 4th digit.
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
## 2019-01-14 NOTE — Discharge Instructions (Signed)
Would recommend soaking the left foot wound warm water for 20 minutes each day then apply back to trace and ointment and redress.  Work note provided.  Return for any new or worse symptoms.  Currently wound seems to be healing well.

## 2019-03-28 ENCOUNTER — Inpatient Hospital Stay (HOSPITAL_COMMUNITY)
Admission: EM | Admit: 2019-03-28 | Discharge: 2019-03-30 | DRG: 349 | Disposition: A | Discharge status: Home / Self Care | Payer: Self-pay | Attending: General Surgery | Admitting: General Surgery

## 2019-03-28 ENCOUNTER — Observation Stay (HOSPITAL_COMMUNITY): Payer: Self-pay

## 2019-03-28 ENCOUNTER — Other Ambulatory Visit: Payer: Self-pay

## 2019-03-28 ENCOUNTER — Emergency Department (HOSPITAL_COMMUNITY): Payer: Self-pay

## 2019-03-28 ENCOUNTER — Encounter (HOSPITAL_COMMUNITY): Payer: Self-pay

## 2019-03-28 DIAGNOSIS — Z800 Family history of malignant neoplasm of digestive organs: Secondary | ICD-10-CM

## 2019-03-28 DIAGNOSIS — K603 Anal fistula: Secondary | ICD-10-CM

## 2019-03-28 DIAGNOSIS — F1490 Cocaine use, unspecified, uncomplicated: Secondary | ICD-10-CM | POA: Diagnosis present

## 2019-03-28 DIAGNOSIS — K610 Anal abscess: Secondary | ICD-10-CM | POA: Diagnosis present

## 2019-03-28 DIAGNOSIS — Z7289 Other problems related to lifestyle: Secondary | ICD-10-CM

## 2019-03-28 DIAGNOSIS — Z01818 Encounter for other preprocedural examination: Secondary | ICD-10-CM

## 2019-03-28 DIAGNOSIS — Z20822 Contact with and (suspected) exposure to covid-19: Secondary | ICD-10-CM | POA: Diagnosis present

## 2019-03-28 DIAGNOSIS — K6131 Horseshoe abscess: Principal | ICD-10-CM | POA: Diagnosis present

## 2019-03-28 DIAGNOSIS — F17210 Nicotine dependence, cigarettes, uncomplicated: Secondary | ICD-10-CM | POA: Diagnosis present

## 2019-03-28 LAB — CBC WITH DIFFERENTIAL/PLATELET
Abs Immature Granulocytes: 0.05 10*3/uL (ref 0.00–0.07)
Basophils Absolute: 0.1 10*3/uL (ref 0.0–0.1)
Basophils Relative: 1 %
Eosinophils Absolute: 0.3 10*3/uL (ref 0.0–0.5)
Eosinophils Relative: 2 %
HCT: 43.3 % (ref 39.0–52.0)
Hemoglobin: 14.1 g/dL (ref 13.0–17.0)
Immature Granulocytes: 0 %
Lymphocytes Relative: 18 %
Lymphs Abs: 2.6 10*3/uL (ref 0.7–4.0)
MCH: 31.1 pg (ref 26.0–34.0)
MCHC: 32.6 g/dL (ref 30.0–36.0)
MCV: 95.4 fL (ref 80.0–100.0)
Monocytes Absolute: 1.9 10*3/uL — ABNORMAL HIGH (ref 0.1–1.0)
Monocytes Relative: 13 %
Neutro Abs: 10 10*3/uL — ABNORMAL HIGH (ref 1.7–7.7)
Neutrophils Relative %: 66 %
Platelets: 422 10*3/uL — ABNORMAL HIGH (ref 150–400)
RBC: 4.54 MIL/uL (ref 4.22–5.81)
RDW: 13 % (ref 11.5–15.5)
WBC: 15 10*3/uL — ABNORMAL HIGH (ref 4.0–10.5)
nRBC: 0 % (ref 0.0–0.2)

## 2019-03-28 LAB — URINALYSIS, ROUTINE W REFLEX MICROSCOPIC
Bacteria, UA: NONE SEEN
Bilirubin Urine: NEGATIVE
Glucose, UA: NEGATIVE mg/dL
Ketones, ur: NEGATIVE mg/dL
Leukocytes,Ua: NEGATIVE
Nitrite: NEGATIVE
Protein, ur: NEGATIVE mg/dL
Specific Gravity, Urine: 1.006 (ref 1.005–1.030)
pH: 6 (ref 5.0–8.0)

## 2019-03-28 LAB — BASIC METABOLIC PANEL
Anion gap: 12 (ref 5–15)
BUN: 11 mg/dL (ref 6–20)
CO2: 24 mmol/L (ref 22–32)
Calcium: 8.9 mg/dL (ref 8.9–10.3)
Chloride: 95 mmol/L — ABNORMAL LOW (ref 98–111)
Creatinine, Ser: 1.11 mg/dL (ref 0.61–1.24)
GFR calc Af Amer: >60 mL/min (ref >60)
GFR calc non Af Amer: >60 mL/min (ref >60)
Glucose, Bld: 110 mg/dL — ABNORMAL HIGH (ref 70–99)
Potassium: 3.6 mmol/L (ref 3.5–5.1)
Sodium: 131 mmol/L — ABNORMAL LOW (ref 135–145)

## 2019-03-28 MED ORDER — IOPAMIDOL (ISOVUE-M 300) INJECTION 61%: 15.0000 mL | Freq: Once | INTRAMUSCULAR | Status: DC | PRN

## 2019-03-28 MED ORDER — ACETAMINOPHEN 650 MG RE SUPP: 650.0000 mg | Freq: Four times a day (QID) | RECTAL | Status: DC | PRN

## 2019-03-28 MED ORDER — HYDROCODONE-ACETAMINOPHEN 5-325 MG PO TABS
2.0000 | ORAL_TABLET | Freq: Once | ORAL | Status: AC
  Administered 2019-03-28: 2 via ORAL
  Filled 2019-03-28: qty 2

## 2019-03-28 MED ORDER — METOPROLOL TARTRATE 5 MG/5ML IV SOLN: 5.0000 mg | Freq: Four times a day (QID) | INTRAVENOUS | Status: DC | PRN

## 2019-03-28 MED ORDER — OXYCODONE HCL 5 MG PO TABS
5.0000 mg | ORAL_TABLET | ORAL | Status: DC | PRN
  Administered 2019-03-29 (×2): 10 mg via ORAL
  Filled 2019-03-28 (×2): qty 2

## 2019-03-28 MED ORDER — LACTATED RINGERS IV SOLN: INTRAVENOUS | Status: DC

## 2019-03-28 MED ORDER — ACETAMINOPHEN 325 MG PO TABS: 650.0000 mg | ORAL_TABLET | Freq: Four times a day (QID) | ORAL | Status: DC | PRN

## 2019-03-28 MED ORDER — ONDANSETRON HCL 4 MG/2ML IJ SOLN: 4.0000 mg | Freq: Four times a day (QID) | INTRAMUSCULAR | Status: DC | PRN

## 2019-03-28 MED ORDER — PIPERACILLIN-TAZOBACTAM 3.375 G IVPB
3.3750 g | Freq: Three times a day (TID) | INTRAVENOUS | Status: DC
  Administered 2019-03-29 – 2019-03-30 (×5): 3.375 g via INTRAVENOUS
  Filled 2019-03-28 (×5): qty 50

## 2019-03-28 MED ORDER — DIPHENHYDRAMINE HCL 50 MG/ML IJ SOLN: 12.5000 mg | Freq: Four times a day (QID) | INTRAMUSCULAR | Status: DC | PRN

## 2019-03-28 MED ORDER — IOHEXOL 300 MG/ML  SOLN
100.0000 mL | Freq: Once | INTRAMUSCULAR | Status: AC | PRN
  Administered 2019-03-28: 22:00:00 100 mL via INTRAVENOUS

## 2019-03-28 MED ORDER — DOCUSATE SODIUM 100 MG PO CAPS
100.0000 mg | ORAL_CAPSULE | Freq: Two times a day (BID) | ORAL | Status: DC
  Administered 2019-03-29 – 2019-03-30 (×4): 100 mg via ORAL
  Filled 2019-03-28 (×4): qty 1

## 2019-03-28 MED ORDER — DIPHENHYDRAMINE HCL 12.5 MG/5ML PO ELIX: 12.5000 mg | ORAL_SOLUTION | Freq: Four times a day (QID) | ORAL | Status: DC | PRN

## 2019-03-28 MED ORDER — MORPHINE SULFATE (PF) 2 MG/ML IV SOLN
2.0000 mg | INTRAVENOUS | Status: DC | PRN
  Administered 2019-03-29: 2 mg via INTRAVENOUS
  Filled 2019-03-28: qty 1

## 2019-03-28 MED ORDER — ONDANSETRON 4 MG PO TBDP: 4.0000 mg | ORAL_TABLET | Freq: Four times a day (QID) | ORAL | Status: DC | PRN

## 2019-03-28 MED ORDER — ENOXAPARIN SODIUM 40 MG/0.4ML ~~LOC~~ SOLN: 40.0000 mg | SUBCUTANEOUS | Status: DC

## 2019-03-28 MED ORDER — METHOCARBAMOL 500 MG PO TABS: 500.0000 mg | ORAL_TABLET | Freq: Four times a day (QID) | ORAL | Status: DC | PRN
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
## 2019-03-28 NOTE — ED Notes (Signed)
Awaiting CT

## 2019-03-28 NOTE — Progress Notes (Signed)
Pharmacy Antibiotic Note  Frederick Roth is a 54 y.o. male admitted on 03/28/2019 with perianal horseshoe abscess.  Pharmacy has been consulted for Zosyn dosing.  Plan: Start Zosyn 3.375g IV q8h (EI)  Pharmacy will continue to monitor renal function, cultures and patient progress.   Height: 5\' 9"  (175.3 cm) Weight: 150 lb (68 kg) IBW/kg (Calculated) : 70.7  Temp (24hrs), Avg:99 F (37.2 C), Min:99 F (37.2 C), Max:99 F (37.2 C)  Recent Labs  Lab 03/28/19 2024  WBC 15.0*  CREATININE 1.11    Estimated Creatinine Clearance: 72.3 mL/min (by C-G formula based on SCr of 1.11 mg/dL).    No Known Allergies  Antimicrobials this admission: Zosyn  >>      Microbiology results:  2/12 Resp PCR:   SARS CoV-2:          Flu A/B:      Thank you for allowing pharmacy to be a part of this patient's care.  4/12 03/28/2019 11:56 PM

## 2019-03-28 NOTE — ED Notes (Signed)
Pt reports that he was referred to Dr Elpidio Anis "a while back" to be evaluated for prostate and colon cancer  Dr Katrinka Blazing has been gone from [facility] practice for over a decade  Prostate eval by KS, PA

## 2019-03-28 NOTE — ED Notes (Signed)
Pt reports lifting heavy roll at work and noted his back and L groin into testicles have been increasingly painful   It has not improved   Here for eval   Pt is not tender to upper leg to palpation, nor lower abd by nurse He is tender to testicular palpation per his report

## 2019-03-28 NOTE — ED Triage Notes (Signed)
Pt reports picking up heavy item at work about two weeks ago, and after lifting object pt reports pain started in left groin with pain around perineum. No abcess or masses noted to area, however area is very tender, testicles tender to touch as well.

## 2019-03-28 NOTE — Progress Notes (Signed)
[facility]  Reviewed imaging. Horseshoe abscess, may need internal drainage. Will need an exam under anesthesia.   Admit NPO IV antibiotics EKG and CXR preop Will discuss with patient in the AM. COVID rapid 2 hr being ordered.  OR tomorrow for Exam under anesthesia/ I&D of perianal abscess.   Algis Greenhouse, MD [facility] [address] [place], [place] [address] [phone]/ [phone] (office)

## 2019-03-28 NOTE — ED Notes (Signed)
From CT awaiting results and reeval

## 2019-03-28 NOTE — ED Notes (Signed)
To CT

## 2019-03-29 ENCOUNTER — Observation Stay (HOSPITAL_COMMUNITY): Payer: Self-pay | Admitting: Anesthesiology

## 2019-03-29 ENCOUNTER — Encounter (HOSPITAL_COMMUNITY)
Admission: EM | Disposition: A | Discharge status: Home / Self Care | Payer: Self-pay | Source: Home / Self Care | Attending: General Surgery

## 2019-03-29 ENCOUNTER — Encounter (HOSPITAL_COMMUNITY): Payer: Self-pay | Admitting: General Surgery

## 2019-03-29 DIAGNOSIS — K610 Anal abscess: Secondary | ICD-10-CM

## 2019-03-29 HISTORY — PX: INCISION AND DRAINAGE PERIRECTAL ABSCESS: SHX1804

## 2019-03-29 LAB — CBC
HCT: 39.9 % (ref 39.0–52.0)
Hemoglobin: 12.7 g/dL — ABNORMAL LOW (ref 13.0–17.0)
MCH: 30.4 pg (ref 26.0–34.0)
MCHC: 31.8 g/dL (ref 30.0–36.0)
MCV: 95.5 fL (ref 80.0–100.0)
Platelets: 405 10*3/uL — ABNORMAL HIGH (ref 150–400)
RBC: 4.18 MIL/uL — ABNORMAL LOW (ref 4.22–5.81)
RDW: 12.9 % (ref 11.5–15.5)
WBC: 12.8 10*3/uL — ABNORMAL HIGH (ref 4.0–10.5)
nRBC: 0 % (ref 0.0–0.2)

## 2019-03-29 LAB — BASIC METABOLIC PANEL
Anion gap: 9 (ref 5–15)
BUN: 10 mg/dL (ref 6–20)
CO2: 25 mmol/L (ref 22–32)
Calcium: 8.5 mg/dL — ABNORMAL LOW (ref 8.9–10.3)
Chloride: 101 mmol/L (ref 98–111)
Creatinine, Ser: 1.01 mg/dL (ref 0.61–1.24)
GFR calc Af Amer: >60 mL/min (ref >60)
GFR calc non Af Amer: >60 mL/min (ref >60)
Glucose, Bld: 106 mg/dL — ABNORMAL HIGH (ref 70–99)
Potassium: 3.8 mmol/L (ref 3.5–5.1)
Sodium: 135 mmol/L (ref 135–145)

## 2019-03-29 LAB — RESPIRATORY PANEL BY RT PCR (FLU A&B, COVID)
Influenza A by PCR: NEGATIVE
Influenza B by PCR: NEGATIVE
SARS Coronavirus 2 by RT PCR: NEGATIVE

## 2019-03-29 LAB — PROTIME-INR
INR: 1.1 (ref 0.8–1.2)
Prothrombin Time: 14 seconds (ref 11.4–15.2)

## 2019-03-29 LAB — MRSA PCR SCREENING: MRSA by PCR: NEGATIVE

## 2019-03-29 LAB — HIV ANTIBODY (ROUTINE TESTING W REFLEX): HIV Screen 4th Generation wRfx: NONREACTIVE

## 2019-03-29 SURGERY — INCISION AND DRAINAGE, ABSCESS, PERIRECTAL
Anesthesia: General

## 2019-03-29 MED ORDER — DIPHENHYDRAMINE HCL 50 MG/ML IJ SOLN
INTRAMUSCULAR | Status: AC
  Filled 2019-03-29: qty 1

## 2019-03-29 MED ORDER — PROPOFOL 10 MG/ML IV BOLUS
INTRAVENOUS | Status: AC
  Filled 2019-03-29: qty 40

## 2019-03-29 MED ORDER — LORAZEPAM 1 MG PO TABS: 0.0000 mg | ORAL_TABLET | Freq: Two times a day (BID) | ORAL | Status: DC

## 2019-03-29 MED ORDER — FOLIC ACID 1 MG PO TABS
1.0000 mg | ORAL_TABLET | Freq: Every day | ORAL | Status: DC
  Administered 2019-03-29 – 2019-03-30 (×2): 1 mg via ORAL
  Filled 2019-03-29 (×2): qty 1

## 2019-03-29 MED ORDER — DEXAMETHASONE SODIUM PHOSPHATE 10 MG/ML IJ SOLN
INTRAMUSCULAR | Status: AC
  Filled 2019-03-29: qty 1

## 2019-03-29 MED ORDER — PROPOFOL 10 MG/ML IV BOLUS
INTRAVENOUS | Status: AC
  Filled 2019-03-29: qty 20

## 2019-03-29 MED ORDER — KETOROLAC TROMETHAMINE 30 MG/ML IJ SOLN
INTRAMUSCULAR | Status: DC | PRN
  Administered 2019-03-29: 30 mg via INTRAVENOUS

## 2019-03-29 MED ORDER — DIPHENHYDRAMINE HCL 50 MG/ML IJ SOLN
50.0000 mg | Freq: Once | INTRAMUSCULAR | Status: AC
  Administered 2019-03-29: 50 mg via INTRAVENOUS

## 2019-03-29 MED ORDER — HYDROMORPHONE HCL 1 MG/ML IJ SOLN
INTRAMUSCULAR | Status: AC
  Filled 2019-03-29: qty 0.5

## 2019-03-29 MED ORDER — BUPIVACAINE LIPOSOME 1.3 % IJ SUSP
INTRAMUSCULAR | Status: AC
  Filled 2019-03-29: qty 20

## 2019-03-29 MED ORDER — FENTANYL CITRATE (PF) 100 MCG/2ML IJ SOLN
INTRAMUSCULAR | Status: DC | PRN
  Administered 2019-03-29 (×5): 50 ug via INTRAVENOUS

## 2019-03-29 MED ORDER — POLYETHYLENE GLYCOL 3350 17 G PO PACK
17.0000 g | PACK | Freq: Every day | ORAL | Status: DC
  Administered 2019-03-29 – 2019-03-30 (×2): 17 g via ORAL
  Filled 2019-03-29 (×2): qty 1

## 2019-03-29 MED ORDER — LORAZEPAM 2 MG/ML IJ SOLN: 1.0000 mg | INTRAMUSCULAR | Status: DC | PRN

## 2019-03-29 MED ORDER — LIDOCAINE 2% (20 MG/ML) 5 ML SYRINGE
INTRAMUSCULAR | Status: AC
  Filled 2019-03-29: qty 5

## 2019-03-29 MED ORDER — MEPERIDINE HCL 50 MG/ML IJ SOLN: 6.2500 mg | INTRAMUSCULAR | Status: DC | PRN

## 2019-03-29 MED ORDER — LIDOCAINE HCL (CARDIAC) PF 100 MG/5ML IV SOSY
PREFILLED_SYRINGE | INTRAVENOUS | Status: DC | PRN
  Administered 2019-03-29: 100 mg via INTRATRACHEAL

## 2019-03-29 MED ORDER — FENTANYL CITRATE (PF) 100 MCG/2ML IJ SOLN
INTRAMUSCULAR | Status: AC
  Filled 2019-03-29: qty 2

## 2019-03-29 MED ORDER — ONDANSETRON HCL 4 MG/2ML IJ SOLN
INTRAMUSCULAR | Status: DC | PRN
  Administered 2019-03-29: 4 mg via INTRAVENOUS

## 2019-03-29 MED ORDER — THIAMINE HCL 100 MG/ML IJ SOLN: 100.0000 mg | Freq: Every day | INTRAMUSCULAR | Status: DC

## 2019-03-29 MED ORDER — LACTATED RINGERS IV SOLN: Freq: Once | INTRAVENOUS | Status: AC

## 2019-03-29 MED ORDER — ADULT MULTIVITAMIN W/MINERALS CH
1.0000 | ORAL_TABLET | Freq: Every day | ORAL | Status: DC
  Administered 2019-03-29 – 2019-03-30 (×2): 1 via ORAL
  Filled 2019-03-29 (×2): qty 1

## 2019-03-29 MED ORDER — CHLORHEXIDINE GLUCONATE CLOTH 2 % EX PADS
6.0000 | MEDICATED_PAD | Freq: Once | CUTANEOUS | Status: AC
  Administered 2019-03-29: 06:00:00 6 via TOPICAL

## 2019-03-29 MED ORDER — CHLORHEXIDINE GLUCONATE CLOTH 2 % EX PADS
6.0000 | MEDICATED_PAD | Freq: Once | CUTANEOUS | Status: AC
  Administered 2019-03-29: 6 via TOPICAL

## 2019-03-29 MED ORDER — PROPOFOL 10 MG/ML IV BOLUS
INTRAVENOUS | Status: DC | PRN
  Administered 2019-03-29: 200 mg via INTRAVENOUS
  Administered 2019-03-29: 50 mg via INTRAVENOUS

## 2019-03-29 MED ORDER — THIAMINE HCL 100 MG PO TABS
100.0000 mg | ORAL_TABLET | Freq: Every day | ORAL | Status: DC
  Administered 2019-03-29 – 2019-03-30 (×2): 100 mg via ORAL
  Filled 2019-03-29 (×2): qty 1

## 2019-03-29 MED ORDER — FENTANYL CITRATE (PF) 250 MCG/5ML IJ SOLN
INTRAMUSCULAR | Status: AC
  Filled 2019-03-29: qty 5

## 2019-03-29 MED ORDER — MIDAZOLAM HCL 2 MG/2ML IJ SOLN
INTRAMUSCULAR | Status: AC
  Filled 2019-03-29: qty 2

## 2019-03-29 MED ORDER — 0.9 % SODIUM CHLORIDE (POUR BTL) OPTIME
TOPICAL | Status: DC | PRN
  Administered 2019-03-29: 1000 mL

## 2019-03-29 MED ORDER — KETOROLAC TROMETHAMINE 30 MG/ML IJ SOLN
INTRAMUSCULAR | Status: AC
  Filled 2019-03-29: qty 1

## 2019-03-29 MED ORDER — LORAZEPAM 1 MG PO TABS: 1.0000 mg | ORAL_TABLET | ORAL | Status: DC | PRN

## 2019-03-29 MED ORDER — HYDROMORPHONE HCL 1 MG/ML IJ SOLN
0.2500 mg | INTRAMUSCULAR | Status: DC | PRN
  Administered 2019-03-29 (×2): 0.5 mg via INTRAVENOUS

## 2019-03-29 MED ORDER — LORAZEPAM 1 MG PO TABS: 0.0000 mg | ORAL_TABLET | Freq: Four times a day (QID) | ORAL | Status: DC

## 2019-03-29 MED ORDER — BUPIVACAINE LIPOSOME 1.3 % IJ SUSP
INTRAMUSCULAR | Status: DC | PRN
  Administered 2019-03-29: 20 mL

## 2019-03-29 MED ORDER — GLYCOPYRROLATE PF 0.2 MG/ML IJ SOSY
PREFILLED_SYRINGE | INTRAMUSCULAR | Status: AC
  Filled 2019-03-29: qty 1

## 2019-03-29 MED ORDER — MIDAZOLAM HCL 2 MG/2ML IJ SOLN
INTRAMUSCULAR | Status: DC | PRN
  Administered 2019-03-29: 2 mg via INTRAVENOUS

## 2019-03-29 MED ORDER — ONDANSETRON HCL 4 MG/2ML IJ SOLN
INTRAMUSCULAR | Status: AC
  Filled 2019-03-29: qty 2

## 2019-03-29 MED ORDER — ENOXAPARIN SODIUM 40 MG/0.4ML ~~LOC~~ SOLN
40.0000 mg | SUBCUTANEOUS | Status: DC
  Administered 2019-03-29: 40 mg via SUBCUTANEOUS
  Filled 2019-03-29: qty 0.4

## 2019-03-29 MED ORDER — DEXAMETHASONE SODIUM PHOSPHATE 10 MG/ML IJ SOLN
INTRAMUSCULAR | Status: DC | PRN
  Administered 2019-03-29: 5 mg via INTRAVENOUS

## 2019-03-29 SURGICAL SUPPLY — 30 items
BAG HAMPER (MISCELLANEOUS) ×3 IMPLANT
CLOTH BEACON ORANGE TIMEOUT ST (SAFETY) ×3 IMPLANT
COVER LIGHT HANDLE STERIS (MISCELLANEOUS) ×6 IMPLANT
COVER WAND RF STERILE (DRAPES) ×3 IMPLANT
DRAPE HALF SHEET 40X57 (DRAPES) ×3 IMPLANT
ELECT REM PT RETURN 9FT ADLT (ELECTROSURGICAL) ×3
ELECTRODE REM PT RTRN 9FT ADLT (ELECTROSURGICAL) ×1 IMPLANT
GAUZE SPONGE 4X4 12PLY STRL (GAUZE/BANDAGES/DRESSINGS) ×6 IMPLANT
GLOVE BIO SURGEON STRL SZ 6.5 (GLOVE) ×4 IMPLANT
GLOVE BIO SURGEONS STRL SZ 6.5 (GLOVE) ×2
GLOVE BIOGEL PI IND STRL 6.5 (GLOVE) ×1 IMPLANT
GLOVE BIOGEL PI IND STRL 7.0 (GLOVE) ×2 IMPLANT
GLOVE BIOGEL PI INDICATOR 6.5 (GLOVE) ×2
GLOVE BIOGEL PI INDICATOR 7.0 (GLOVE) ×4
GLOVE ECLIPSE 6.5 STRL STRAW (GLOVE) ×3 IMPLANT
GLOVE ECLIPSE 7.5 STRL STRAW (GLOVE) ×3 IMPLANT
GOWN STRL REUS W/TWL LRG LVL3 (GOWN DISPOSABLE) ×6 IMPLANT
KIT TURNOVER KIT A (KITS) ×3 IMPLANT
MANIFOLD NEPTUNE II (INSTRUMENTS) ×3 IMPLANT
NDL SAFETY ECLIPSE 18X1.5 (NEEDLE) ×2 IMPLANT
NEEDLE HYPO 18GX1.5 SHARP (NEEDLE) ×4
NS IRRIG 1000ML POUR BTL (IV SOLUTION) ×3 IMPLANT
PACK MINOR (CUSTOM PROCEDURE TRAY) ×3 IMPLANT
PAD ARMBOARD 7.5X6 YLW CONV (MISCELLANEOUS) ×3 IMPLANT
SET BASIN LINEN APH (SET/KITS/TRAYS/PACK) ×3 IMPLANT
SUT SILK 0 FSL (SUTURE) ×6 IMPLANT
SWAB CULTURE LIQ STUART DBL (MISCELLANEOUS) ×6 IMPLANT
SYR 10ML LL (SYRINGE) ×9 IMPLANT
SYR 20ML LL LF (SYRINGE) ×3 IMPLANT
VESSEL LOOPS MAXI RED (MISCELLANEOUS) ×3 IMPLANT

## 2019-03-29 NOTE — Progress Notes (Addendum)
[facility]  Notified daughter, Myna Bright of surgery.  Told her we will get him home tomorrow with some antibiotics and pain medications. He needs to have a bowel movement while he is here to make sure he does ok.   Drains in place 3 total (1 seton and 2 superficial drains).   Regular diet Sitz baths PRN for pain Miralax to help with BM while in hospital Continue Zosyn for now CIWA protocol ordered for alcohol use   Algis Greenhouse, MD [facility] [address] [place], [place] [address] [phone]/ [phone] (office)

## 2019-03-29 NOTE — Anesthesia Procedure Notes (Signed)
Procedure Name: LMA Insertion Date/Time: 03/29/2019 2:06 PM Performed by: Molli Barrows, MD Pre-anesthesia Checklist: Patient identified, Emergency Drugs available, Suction available and Patient being monitored Patient Re-evaluated:Patient Re-evaluated prior to induction Oxygen Delivery Method: Circle system utilized Preoxygenation: Pre-oxygenation with 100% oxygen Induction Type: IV induction Ventilation: Mask ventilation without difficulty LMA: LMA inserted LMA Size: 4.0 Tube type: Oral Number of attempts: 2 Placement Confirmation: positive ETCO2 and breath sounds checked- equal and bilateral Tube secured with: Tape Dental Injury: Teeth and Oropharynx as per pre-operative assessment

## 2019-03-29 NOTE — Transfer of Care (Signed)
Immediate Anesthesia Transfer of Care Note  Patient: Frederick Roth  Procedure(s) Performed: IRRIGATION AND DEBRIDEMENT PERIRECTAL ABSCESS (N/A )  Patient Location: PACU  Anesthesia Type:General  Level of Consciousness: awake, sedated and drowsy  Airway & Oxygen Therapy: Patient Spontanous Breathing  Post-op Assessment: Report given to RN and Post -op Vital signs reviewed and stable  Post vital signs: stable  Last Vitals:  Vitals Value Taken Time  BP 102/59   Temp 99.1   Pulse 81   Resp 16   SpO2 99     Last Pain:  Vitals:   03/29/19 1330  TempSrc:   PainSc: 8          Complications: No apparent anesthesia complications

## 2019-03-29 NOTE — Anesthesia Preprocedure Evaluation (Addendum)
Anesthesia Evaluation  Patient identified by MRN, date of birth, ID band Patient awake    Reviewed: Allergy & Precautions, NPO status , Patient's Chart, lab work & pertinent test results, reviewed documented beta blocker date and time   Airway Mallampati: II  TM Distance: >3 FB Neck ROM: Full    Dental  (+) Teeth Intact, Dental Advisory Given   Pulmonary Current Smoker and Patient abstained from smoking.,    Pulmonary exam normal breath sounds clear to auscultation       Cardiovascular Exercise Tolerance: Good Hypertension: bet blockers in hospital, not at home. Normal cardiovascular exam Rhythm:Regular Rate:Normal  29-Mar-2019 09:30:14 [facility]-AP-300 ROUTINE RECORD Normal sinus rhythm Normal ECG   Neuro/Psych negative neurological ROS  negative psych ROS   GI/Hepatic negative GI ROS, (+)     substance abuse (cocaine use 15 years ago, currently drinks 4 drinks/day)  alcohol use and cocaine use,   Endo/Other    Renal/GU negative Renal ROS     Musculoskeletal  (+) Arthritis , Osteoarthritis,    Abdominal   Peds  Hematology negative hematology ROS (+)   Anesthesia Other Findings   Reproductive/Obstetrics                          Anesthesia Physical Anesthesia Plan  ASA: II and emergent  Anesthesia Plan: General   Post-op Pain Management:    Induction: Intravenous  PONV Risk Score and Plan: 2 and Ondansetron, Dexamethasone and Midazolam  Airway Management Planned: LMA  Additional Equipment:   Intra-op Plan:   Post-operative Plan: Extubation in OR  Informed Consent: I have reviewed the patients History and Physical, chart, labs and discussed the procedure including the risks, benefits and alternatives for the proposed anesthesia with the patient or authorized representative who has indicated his/her understanding and acceptance.     Dental advisory  given  Plan Discussed with: Surgeon  Anesthesia Plan Comments:        Anesthesia Quick Evaluation

## 2019-03-29 NOTE — Addendum Note (Signed)
Addendum  created 03/29/19 1626 by Molli Barrows, MD   Clinical Note Signed

## 2019-03-29 NOTE — Anesthesia Postprocedure Evaluation (Signed)
Anesthesia Post Note  Patient: [name]  Procedure(s) Performed: IRRIGATION AND DEBRIDEMENT PERIRECTAL HORSESHOE  ABSCESS PLACEMENT OF POSTERIOR SETON (N/A )  Patient location during evaluation: PACU Anesthesia Type: General Level of consciousness: awake and alert and oriented Pain management: pain level controlled Vital Signs Assessment: post-procedure vital signs reviewed and stable Respiratory status: spontaneous breathing and respiratory function stable Cardiovascular status: blood pressure returned to baseline and stable Postop Assessment: no apparent nausea or vomiting Anesthetic complications: no (c/o iitching, no rash, diphenhydramine 50 mg was given, itcihing improved.) Comments: c/o itching, no rash, diphenhydramine 50 mg was given, itching improved.     Last Vitals:  Vitals:   03/29/19 1545 03/29/19 1613  BP: 95/78 112/73  Pulse: 78 77  Resp: 15 15  Temp:  36.4 C  SpO2: 100% 99%    Last Pain:  Vitals:   03/29/19 1613  TempSrc: Oral  PainSc:                  Frederick Roth C Frederick Roth

## 2019-03-29 NOTE — Op Note (Signed)
[facility] Operative Note  03/29/19  Preoperative Diagnosis:  Horseshoe abscess    Postoperative Diagnosis: Horseshoe abscess with posterior transsphincteric fistula    Procedure(s) Performed: Incision and drainage of horseshoe abscess with drain placement X 2; seton placement to posterior fistula    Surgeon: Lanell Matar. Constance Haw, MD   Assistants: No qualified resident was available    Anesthesia: General endotracheal   Anesthesiologist: Denese Killings, MD    Specimens:  Culture of purulence    Estimated Blood Loss: Minimal   Blood Replacement: None    Complications: None    Wound Class: Dirty/ Infected    Operative Indications: Mr. Folts is a 54 yo with a horseshoe abscess who needs exam under anesthesia and drainage of the abscess.  The CT was reviewed carefully. We discussed the risk of the procedure including but not limited to bleeding, infection, drain placement, needing additional surgeries, and injury to the sphincter, and he opted to proceed.    Findings: Posterior horseshoe abscess with obvious bulging fluctuance internally and fluctuance externally, aspirated externally and purulent pocket identified, area opened to the left and right around the sphincter complex and drains placed (red vessel loops) and posterior transsphincteric fistula noted with seton placement ,    Procedure: The patient was taken to the operating room and placed supine. General endotracheal anesthesia was induced. Intravenous antibiotics were administered per protocol.  He was then positioned in lithotomy with pressure points padded.  His perineal region and anal region were prepped and draped the standard fashion.  A digital region exam was performed and there is fluctuance noted internally at the posterior midline. In additional external superficial fluctuance was noted.  Anoscopy was placed and a bulging mucosa was noted posteriorly but this was not drainage.  To ensure  that there was indeed superficial external fluctuance, I used a 18 gauge needle to aspirate the pocket.  Based on the CT and the physical exam findings it was evident that he had a horseshoe abscess posteriorly but also a corresponding transsphincteric fistula.    I opened at the posterior midline and a hemostatic was used to open the pocket and break up loculations. The hemostatic was passed easily into the internal canal and purulence was noted from internally as well.  The horseshoe abscess points on each side of the anus were palpated at the 3 and 9 o'clock positions.  The 18 gauge needle again was used to aspirate back bloody purulence and the hemostat was used to find the track in both directions. The skin at the 3 and 9 o'clock positions superficially was incised.  The horseshoe abscess was fully drained, and to keep this open red vessel loops were placed from the superficial external opening posterior to the right and left openings at 3 and 9 o'clock to aid with drainage. These were tired in place with 0 silk sutures in two places. The drain loops move freely in the cavity.  The transsphincteric fistula was again noted and palpated. There was edematous tissue in the area and it felt like possibly as much as 1 cm of muscle involved. To be safe, a posterior seton was placed ( red vessel loop), and this again was secured in two places with 0 silk suture. The seton moved freely in the posterior fistula.  Xparel was placed in the field.    I would hope to be able to remove the superficial drains in the office once the abscess has fully drained, and the seton through  the fistula will require an additional surgery given that it is transsphincteric.   Final inspection revealed acceptable hemostasis. All counts were correct at the end of the case. The patient was awakened from anesthesia and extubated without complication.  The patient went to the PACU in stable condition.   Algis Greenhouse, MD [facility] [address] [place], [place] [address] [phone] (office)
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
## 2019-03-30 DIAGNOSIS — K603 Anal fistula: Secondary | ICD-10-CM

## 2019-03-30 LAB — CBC WITH DIFFERENTIAL/PLATELET
Abs Immature Granulocytes: 0.17 10*3/uL — ABNORMAL HIGH (ref 0.00–0.07)
Basophils Absolute: 0 10*3/uL (ref 0.0–0.1)
Basophils Relative: 0 %
Eosinophils Absolute: 0 10*3/uL (ref 0.0–0.5)
Eosinophils Relative: 0 %
HCT: 37.4 % — ABNORMAL LOW (ref 39.0–52.0)
Hemoglobin: 12.2 g/dL — ABNORMAL LOW (ref 13.0–17.0)
Immature Granulocytes: 1 %
Lymphocytes Relative: 6 %
Lymphs Abs: 1.2 10*3/uL (ref 0.7–4.0)
MCH: 31 pg (ref 26.0–34.0)
MCHC: 32.6 g/dL (ref 30.0–36.0)
MCV: 95.2 fL (ref 80.0–100.0)
Monocytes Absolute: 1.8 10*3/uL — ABNORMAL HIGH (ref 0.1–1.0)
Monocytes Relative: 9 %
Neutro Abs: 18 10*3/uL — ABNORMAL HIGH (ref 1.7–7.7)
Neutrophils Relative %: 84 %
Platelets: 388 10*3/uL (ref 150–400)
RBC: 3.93 MIL/uL — ABNORMAL LOW (ref 4.22–5.81)
RDW: 12.9 % (ref 11.5–15.5)
WBC: 21.2 10*3/uL — ABNORMAL HIGH (ref 4.0–10.5)
nRBC: 0 % (ref 0.0–0.2)

## 2019-03-30 LAB — BASIC METABOLIC PANEL
Anion gap: 10 (ref 5–15)
BUN: 19 mg/dL (ref 6–20)
CO2: 25 mmol/L (ref 22–32)
Calcium: 8.7 mg/dL — ABNORMAL LOW (ref 8.9–10.3)
Chloride: 99 mmol/L (ref 98–111)
Creatinine, Ser: 1.07 mg/dL (ref 0.61–1.24)
GFR calc Af Amer: >60 mL/min (ref >60)
GFR calc non Af Amer: >60 mL/min (ref >60)
Glucose, Bld: 135 mg/dL — ABNORMAL HIGH (ref 70–99)
Potassium: 4 mmol/L (ref 3.5–5.1)
Sodium: 134 mmol/L — ABNORMAL LOW (ref 135–145)

## 2019-03-30 MED ORDER — ONDANSETRON 4 MG PO TBDP: 4.0000 mg | ORAL_TABLET | Freq: Four times a day (QID) | ORAL | 0 refills | Status: DC | PRN

## 2019-03-30 MED ORDER — AMOXICILLIN-POT CLAVULANATE 875-125 MG PO TABS: 1.0000 | ORAL_TABLET | Freq: Two times a day (BID) | ORAL | 0 refills | Status: AC

## 2019-03-30 MED ORDER — DOCUSATE SODIUM 100 MG PO CAPS: 100.0000 mg | ORAL_CAPSULE | Freq: Two times a day (BID) | ORAL | 2 refills | Status: DC

## 2019-03-30 MED ORDER — POLYETHYLENE GLYCOL 3350 17 G PO PACK: 17.0000 g | PACK | Freq: Every day | ORAL | 0 refills | Status: DC | PRN

## 2019-03-30 MED ORDER — OXYCODONE HCL 5 MG PO TABS: 5.0000 mg | ORAL_TABLET | ORAL | 0 refills | Status: DC | PRN

## 2019-03-30 NOTE — Discharge Summary (Signed)
Physician Discharge Summary  Patient ID: FREDRIC SLABACH MRN: [id number] DOB/AGE: [birth year] 54 y.o.  Admit date: 03/28/2019 Discharge date: 03/30/2019  Admission Diagnoses: Horseshoe perianal abscess   Discharge Diagnoses:  Principal Problem:   Perianal abscess Active Problems:   Fistula-in-ano   Discharged Condition: fair  Hospital Course: Mr. Lerette is a very sweet 54 yo who presented to the hospital with perirectal pain that had started in the last few days. He has had some groin pain but this pain developed after the groin pain and worsened with time. He was found to have a horseshoe fistula in the ED and underwent I&D of the abscess with drain placement as well as seton placement to posterior fistula in ano.  He has been doing well since the OR and had a BM. The BM was tolerable. His pain is better and he is mostly just sore. The BM was soft in nature. He has been up and had a shower this AM and is ready to go home.    Consults: None  Significant Diagnostic Studies: CT a/p- horseshoe abscess, no inguinal hernia   Treatments: 03/29/2019- Incision and drainage of horseshoe abscess with drain placement X 2; seton placement to posterior fistula  Discharge Exam: Blood pressure (!) 84/48, pulse (!) 57, temperature 98.5 F (36.9 C), temperature source Oral, resp. rate 16, height 5\' 9"  (1.753 m), weight 64 kg, SpO2 98 %. General appearance: alert, cooperative and no distress Resp: normal work of breathing GI: soft, non-tender; bowel sounds normal; no masses,  no organomegaly and drains in place, tender in the area, noted drainage as expected Extremities: extremities normal, atraumatic, no cyanosis or edema  Disposition: Discharge disposition: 01-Home or Self Care       Discharge Instructions    Call MD for:  difficulty breathing, headache or visual disturbances   Complete by: As directed    Call MD for:  extreme fatigue   Complete by: As directed    Call MD for:   persistant dizziness or light-headedness   Complete by: As directed    Call MD for:  persistant nausea and vomiting   Complete by: As directed    Call MD for:  redness, tenderness, or signs of infection (pain, swelling, redness, odor or green/yellow discharge around incision site)   Complete by: As directed    Call MD for:  severe uncontrolled pain   Complete by: As directed    Call MD for:  temperature >100.4   Complete by: As directed    Increase activity slowly   Complete by: As directed      Allergies as of 03/30/2019   No Known Allergies     Medication List    STOP taking these medications   clindamycin 300 MG capsule Commonly known as: CLEOCIN   diclofenac sodium 1 % Gel Commonly known as: Voltaren   varenicline 0.5 MG X 11 & 1 MG X 42 tablet Commonly known as: Chantix Starting Month Pak   varenicline 1 MG tablet Commonly known as: Chantix Continuing Month Pak     TAKE these medications   amoxicillin-clavulanate 875-125 MG tablet Commonly known as: Augmentin Take 1 tablet by mouth every 12 (twelve) hours for 7 days.   docusate sodium 100 MG capsule Commonly known as: COLACE Take 1 capsule (100 mg total) by mouth 2 (two) times daily.   methocarbamol 500 MG tablet Commonly known as: ROBAXIN Take 1 tablet (500 mg total) by mouth every 8 (eight) hours as needed for muscle  spasms.   ondansetron 4 MG disintegrating tablet Commonly known as: ZOFRAN-ODT Take 1 tablet (4 mg total) by mouth every 6 (six) hours as needed for nausea.   oxyCODONE 5 MG immediate release tablet Commonly known as: Oxy IR/ROXICODONE Take 1 tablet (5 mg total) by mouth every 4 (four) hours as needed for severe pain or breakthrough pain.   polyethylene glycol 17 g packet Commonly known as: MIRALAX / GLYCOLAX Take 17 g by mouth daily as needed for severe constipation.      Follow-up Information    [name], MD Follow up on 04/03/2019.   Specialty: General Surgery Why: follow  up for wound check; someone should call you early this week with the time, if you do not hear by Wednesday, please call the office  Contact information: [address] [place] [facility] [address] [phone]           Signed: [name] 03/30/2019, 2:31 PM

## 2019-03-30 NOTE — Discharge Instructions (Signed)
Seton Placement/ Anal Fistula Discharge Instructions:   What is the purpose of a seton placement? Setons are used to treat anal fistulas. A fistula is an abnormal tunnel that connects two structures. An anal fistula is an abnormal tunnel that forms between the anus and the surrounding skin. The fistula's internal opening is in the anus and the external opening is typically on the skin around the anus or on the buttocks.  Activity:  Many individuals are able to return to work and resume routine activities the day after their procedure. Some people require a week off from work if the surgery is more extensive. Generally, within 1-2 weeks, surgical discomfort is Minimal.  Care Instructions: You can resume your normal diet once you have sufficiently recovered from anesthesia. You should drink lots of liquid. Water is best. Try to drink at least 6-8 glasses of liquid daily.  Medications: It is very important to prevent constipation after surgery. You should take a stool softener, such as docusate sodium (Colace), twice a day for the first two weeks. Also take a fiber supplement such as Benefiber, Citrucel, or Metamucil, twice a day every day. Consult your pharmacist if you need help. If your stools become loose, you can stop taking the softeners.  Take tylenol and ibuprofen as needed for pain control, alternating every 4-6 hours.  Example:  Tylenol 1000mg  @ 6am, 12noon, 6pm, (Do not exceed 4000mg  of tylenol a day).  Ibuprofen 800mg  @ 9am, 3pm, 9pm, 3am (Do not exceed 3600mg  of ibuprofen a day).  Take Roxicodone for breakthrough pain every 4 hours.  Drink plenty of water to also prevent constipation.  Do not drive, operate machinery, or drink alcohol when taking narcotic pain medication.  Within a few days, your pain should be sufficiently controlled with medications such as ibuprofen or acetaminophen.  Dressing: You can replace your pad as needed after surgery. Feminine  pads work best.  It is normal to see some bloody drainage on the dressing.  Bleeding should not be heavy or continuous.   Hygiene: Keep the surgical area clean by taking Sitz baths (or tub baths) several times per day. These are helpful for cleaning after each bowel movement. Frequent showering is an alternative to baths, although many patients find baths soothing after surgery. . Equipment for can be purchased at or medical/surgical supply stores. o Use warm (not hot) water for cleansing. o Pat the area dry or use a hairdryer to evaporate any residual moisture. o If you use a hairdryer, use the cool or warm setting, to avoid potential heat injury to the skin. . Sitting on a pillow or ice pack may also provide relief. Do not apply ice directly to the skin, use a towel or pillow case as a buffer. We do not encourage sitting on an inflatable ring ("doughnut") as this leads to unopposed downward pressure in the anal area. . You should gently rotate the [facility], every other day or so, to prevent it from getting crusted with bodily fluids. . Occasionally, the may become displaced and fall out. If this occurs, it is not an emergency. Please call our office the next business day so that we may evaluate.  What symptoms should I expect? . It is normal to have pain for up to 1-2 weeks. Thereafter, you may notice discomfort with prolonged sitting and certain activities. Pain should not be constant or worsening.  . Placement of Setons may stimulate mucus production so the volume of drainage  you are having may increase at first. The volume of drainage should lessen as healing occurs.    Anorectal Abscess An abscess is an infected area that contains a collection of pus. An anorectal abscess is an abscess that is near the opening of the anus or around the rectum. Without treatment, an anorectal abscess can become larger and cause other problems, such as a more  serious body-wide infection or pain, especially during bowel movements. What are the causes? This condition is caused by plugged glands or an infection in one of these areas:  The anus.  The area between the anus and the scrotum in males or between the anus and the vagina in females (perineum). What increases the risk? The following factors may make you more likely to develop this condition:  Diabetes or inflammatory bowel disease.  Having a body defense system (immune system) that is weak.  Engaging in anal sex.  Having a sexually transmitted infection (STI).  Certain kinds of cancer, such as rectal carcinoma, leukemia, or lymphoma. What are the signs or symptoms? The main symptom of this condition is pain. The pain may be a throbbing pain that gets worse during bowel movements. Other symptoms include:  Swelling and redness in the area of the abscess. The redness may go beyond the abscess and appear as a red streak on the skin.  A visible, painful lump, or a lump that can be felt when touched.  Bleeding or pus-like discharge from the area.  Fever.  General weakness.  Constipation.  Diarrhea. How is this diagnosed? This condition is diagnosed based on your medical history and a physical exam of the affected area.  This may involve examining the rectal area with a gloved hand (digital rectal exam).  Sometimes, the health care provider needs to look into the rectum using a probe, scope, or imaging test.  For women, it may require a careful vaginal exam. How is this treated? Treatment for this condition may include:  Incision and drainage surgery. This involves making an incision over the abscess to drain the pus.  Medicines, including antibiotic medicine, pain medicine, stool softeners, or laxatives. Follow these instructions at home: Medicines  Take over-the-counter and prescription medicines only as told by your health care provider.  If you were prescribed an  antibiotic medicine, use it as told by your health care provider. Do not stop using the antibiotic even if you start to feel better.  Do not drive or use heavy machinery while taking prescription pain medicine. Wound care   If one or more drains were placed in the abscess cavity, be careful not to pull at them. Your health care provider will tell you how long they need to remain in place.  Check your incision area every day for signs of infection. Check for: ? More redness, swelling, or pain. ? More fluid or blood. ? Warmth. ? Pus or a bad smell. Managing pain, stiffness, and swelling   Take a sitz bath 3-4 times a day and after bowel movements. This will help reduce pain and swelling.  To relieve pain, try sitting: ? On a heating pad with the setting on low. ? On an inflatable donut-shaped cushion.  If directed, put ice on the affected area: ? Put ice in a plastic bag. ? Place a towel between your skin and the bag. ? Leave the ice on for 20 minutes, 2-3 times a day. General instructions  Follow any diet instructions given by your health care provider.  Keep all follow-up visits as told by your health care provider. This is important. Contact a health care provider if you have:  Bleeding from your incision.  Pain, swelling, or redness that does not improve or gets worse.  Trouble passing stool or urine.  Symptoms that return after treatment. Get help right away if you:  Have problems moving or using your legs.  Have severe or increasing pain.  Have swelling in the affected area that suddenly gets worse.  Have a large increase in bleeding or passing of pus.  Develop chills or a fever. Summary  An anorectal abscess is an abscess that is near the opening of the anus or around the rectum. An abscess is an infected area that contains a collection of pus.  The main symptom of this condition is pain. It may be a throbbing pain that gets worse during bowel  movements.  Treatment for an anorectal abscess may include surgery to drain the pus from the abscess. Medicines and sitz baths may also be a part of your treatment plan. This information is not intended to replace advice given to you by your health care provider. Make sure you discuss any questions you have with your health care provider. Document Revised: 03/08/2017 Document Reviewed: 03/08/2017 Elsevier Patient Education  2020 [employer].

## 2019-03-30 NOTE — Discharge Planning (Signed)
IV x2 removed.  RN assessment and VS revealed stability for DC to home.  Discharge papers given, explained and educated.  Informed of suggested FU appt and agreed to set up since discharging on Sunday.  Scripts sent to pharm per patient request. Patient had BM prior to DC.  Once ready, will be wheeled to front and family transporting home via car.

## 2019-04-01 LAB — AEROBIC/ANAEROBIC CULTURE W GRAM STAIN (SURGICAL/DEEP WOUND)

## 2019-04-08 ENCOUNTER — Ambulatory Visit (INDEPENDENT_AMBULATORY_CARE_PROVIDER_SITE_OTHER): Payer: Self-pay | Admitting: General Surgery

## 2019-04-08 ENCOUNTER — Encounter: Payer: Self-pay | Admitting: General Surgery

## 2019-04-08 ENCOUNTER — Other Ambulatory Visit: Payer: Self-pay

## 2019-04-08 VITALS — BP 106/62 | HR 64 | Temp 98.1°F | Resp 12 | Ht 69.0 in | Wt 138.4 lb

## 2019-04-08 DIAGNOSIS — K603 Anal fistula: Secondary | ICD-10-CM

## 2019-04-08 DIAGNOSIS — K610 Anal abscess: Secondary | ICD-10-CM

## 2019-04-08 MED ORDER — OXYCODONE HCL 5 MG PO TABS: 5.0000 mg | ORAL_TABLET | ORAL | 0 refills | Status: DC | PRN

## 2019-04-08 NOTE — Patient Instructions (Signed)
Do baths four or more times a day if needed. Do bath after bowel movement. Keep area dry otherwise. Get some ladies pads from the store and wear.  Walk around and get moving.

## 2019-04-08 NOTE — Progress Notes (Signed)
[facility] Note   HPI:  54 y.o. Male presents to clinic for post-op follow-up evaluation of his perianal abscess s/p drainage and seton placement. Patient reports he is doing well overall but still with some pain. He has regular BMs. He has been laying around most of the day. He has not been up or out much.  He has only been doing sitz baths after BM.   Review of Systems:  + Continued drainage Regular BMs Pain in the area  All other review of systems: otherwise negative   Vital Signs:  BP 106/62   Pulse 64   Temp 98.1 F (36.7 C) (Oral)   Resp 12   Ht 5\' 9"  (1.753 m)   Wt 138 lb 6.4 oz (62.8 kg)   SpO2 97%   BMI 20.44 kg/m    Physical Exam:  Physical Exam Vitals reviewed.  HENT:     Head: Normocephalic.  Cardiovascular:     Rate and Rhythm: Normal rate.  Pulmonary:     Effort: Pulmonary effort is normal.  Genitourinary:    Comments: Drains moved and area less indurated, some drainage, area cleaned with saline, difficult to know how much muscle involved with midline seton, will have to continue to assess  Neurological:     Mental Status: He is alert.     Assessment:  54 y.o. 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 Doing fair.  Plan:  Do baths four or more times a day if needed. Do bath after bowel movement. Keep area dry otherwise. Get some ladies pads from the store and wear.  Walk around and get moving. Will plan to get drains out next week and seton will need to remain in place for at least 6 weeks. Will need exam under anesthesia to remove this safely given possibility of muscle involvement.  Refilled him some roxicodone for pain 5mg  q4 PRN ( # 15)   Future Appointments  Date Time Provider Department Center  04/15/2019 11:15 AM , MD [facility] None    All of the above recommendations were discussed with the patient, and all of patient's questions were answered to his expressed  satisfaction.  06/15/2019, MD [facility] [address] [place] [facility] [place], [place] [phone] (office)

## 2019-04-15 ENCOUNTER — Encounter: Payer: Self-pay | Admitting: General Surgery

## 2019-04-15 ENCOUNTER — Other Ambulatory Visit: Payer: Self-pay

## 2019-04-15 ENCOUNTER — Ambulatory Visit (INDEPENDENT_AMBULATORY_CARE_PROVIDER_SITE_OTHER): Payer: Self-pay | Admitting: General Surgery

## 2019-04-15 VITALS — BP 114/75 | HR 57 | Temp 97.8°F | Resp 12 | Ht 69.0 in | Wt 141.0 lb

## 2019-04-15 DIAGNOSIS — K603 Anal fistula: Secondary | ICD-10-CM

## 2019-04-15 DIAGNOSIS — K610 Anal abscess: Secondary | ICD-10-CM

## 2019-04-15 NOTE — Progress Notes (Signed)
[facility] Note   HPI:  54 y.o. 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 He has been doing better and says he has been moving around.  He says he has been doing more sitz baths and having less drainage. Pain is mostly with BMs.   Review of Systems:  No fever or chills Some drainage Pain with BM All other review of systems: otherwise negative   Vital Signs:  BP 114/75   Pulse (!) 57   Temp 97.8 F (36.6 C) (Oral)   Resp 12   Ht 5\' 9"  (1.753 m)   Wt 141 lb (64 kg)   SpO2 98%   BMI 20.82 kg/m    Physical Exam:  Physical Exam Vitals reviewed.  HENT:     Head: Normocephalic.  Cardiovascular:     Rate and Rhythm: Normal rate.  Pulmonary:     Effort: Pulmonary effort is normal.  Abdominal:     General: There is no distension.     Palpations: Abdomen is soft.     Tenderness: There is no abdominal tenderness.  Genitourinary:    Comments: Perianal area with no tenderness or drainage, no induration, drains in place laterally, seton in place posterior, removed the superficial drains laterally Musculoskeletal:        General: No swelling. Normal range of motion.  Neurological:     General: No focal deficit present.     Mental Status: He is alert and oriented to person, place, and time.  Psychiatric:        Mood and Affect: Mood normal.        Behavior: Behavior normal.        Thought Content: Thought content normal.        Judgment: Judgment normal.     Assessment:  54 y.o. 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 He has been doing very well.  51 will need to stay in for at least 8 weeks.   Plan:   Future Appointments  Date Time Provider Department Center  05/20/2019 10:15 AM 07/20/2019, MD [facility] None    Will need exam under anesthesia to see about fistulotomy versus continued seton and referral to Colorectal. 3  All of the  above recommendations were discussed with the patient and patient's family, and all of patient's and family's questions were answered to their expressed satisfaction.  [name], MD [facility] [address] [place], [place] [phone] (office)

## 2019-04-15 NOTE — Patient Instructions (Signed)
Continue sitz baths. Keep area clean and dry.

## 2019-05-20 ENCOUNTER — Other Ambulatory Visit: Payer: Self-pay

## 2019-05-20 ENCOUNTER — Encounter: Payer: Self-pay | Admitting: General Surgery

## 2019-05-20 ENCOUNTER — Ambulatory Visit (INDEPENDENT_AMBULATORY_CARE_PROVIDER_SITE_OTHER): Payer: Self-pay | Admitting: General Surgery

## 2019-05-20 VITALS — BP 123/73 | HR 55 | Temp 97.9°F | Resp 12 | Ht 69.0 in | Wt 151.0 lb

## 2019-05-20 DIAGNOSIS — K603 Anal fistula: Secondary | ICD-10-CM

## 2019-05-20 MED ORDER — OXYCODONE HCL 5 MG PO TABS: 5.0000 mg | ORAL_TABLET | ORAL | 0 refills | Status: DC | PRN

## 2019-05-20 NOTE — Patient Instructions (Signed)
Anal Fistula  An anal fistula is a hole that develops between the bowel and the skin near the anus. The anus allows stool (feces) to leave the body. The anus has many tiny glands that make lubricating fluid. Sometimes, these glands become plugged and infected. This can cause a fluid-filled pocket (abscess) to form. An anal fistula often occurs when an abscess becomes infected and then develops into a hole between the bowel and the skin. What are the causes? In most cases, an anal fistula is caused by a past or current buildup of pus around the anus (anal abscess). Other causes include:  A complication of surgery.  Injury to the rectum or the area around it.  Using high-energy beams (radiation) to treat the area around the rectum. What increases the risk? You are more likely to develop this condition if you have certain medical conditions or diseases, including:  Chronic inflammatory bowel disease, such as Crohn's disease or ulcerative colitis.  Colon cancer or rectal cancer.  Diverticular disease, such as diverticulitis.  A sexually transmitted infection, or STI, such as gonorrhea, chlamydia, or syphilis.  An infection that is caused by HIV. What are the signs or symptoms? Symptoms of this condition include:  Throbbing or constant pain that may be worse while you are sitting.  Swelling or irritation around the anus.  Pus or blood from an opening near the anus.  Pain when passing stool.  Fever or chills. How is this diagnosed? This condition is diagnosed based on:  A physical exam. This may include: ? An exam to find the external opening of the fistula. ? An exam with a probe or scope to help locate the internal opening of the fistula. ? An exam of the rectum with a gloved hand (digital rectal exam).  Imaging tests that use dye to find the exact location and path of the fistula. Tests may include: ? X-rays. ? Ultrasound. ? CT scan. ? MRI.  Other tests to find the cause  of the anal fistula. How is this treated? This condition is most commonly treated with surgery. The type of surgery that is used will depend on where the fistula is located and how complex the fistula is. Surgery may include:  A fistulotomy. The whole fistula is opened up, and the contents are drained to promote healing.  Seton placement. A silk string (seton) is placed into the fistula during a fistulotomy. This helps to drain any infection and promote healing.  Advancement flap procedure. Tissue is removed from your rectum or the skin around the anus and attached to the opening of the fistula.  Bioprosthetic plug. A cone-shaped plug is made from your tissue and is used to block the opening of the fistula. Some anal fistulas do not require surgery. A nonsurgical treatment option involves injecting a fibrin glue to seal the fistula. You also may be prescribed an antibiotic medicine to treat any infection. Follow these instructions at home: Medicines  Take over-the-counter and prescription medicines only as told by your health care provider.  If you were prescribed an antibiotic medicine, take it as told by your health care provider. Do not stop taking the antibiotic even if you start to feel better.  Use a stool softener or a laxative if told to do so by your health care provider. General instructions   Eat a high-fiber diet as told by your health care provider. This can help to prevent constipation.  Drink enough fluid to keep your urine pale yellow.    Take a warm sitz bath for 15-20 minutes, 3-4 times per day, or as told by your health care provider. Sitz baths can ease your pain and discomfort and help with healing.  Follow good hygiene to keep the anal area as clean and dry as possible. Use wet toilet paper or a moist towelette after each bowel movement.  Keep all follow-up visits as told by your health care provider. This is important. Contact a health care provider if you  have:  Increased pain that is not controlled with medicines.  New redness or swelling around the anal area.  New fluid, blood, or pus coming from the anal area.  Tenderness or warmth around the anal area. Get help right away if you have:  A fever.  Severe pain.  Chills or diarrhea.  Severe problems urinating or having a bowel movement. Summary  An anal fistula is a hole that develops between the bowel and the skin near the anus.  This condition is most often caused by a buildup of pus around the anus (anal abscess). Other causes include a complication of surgery, an injury to the rectum, or the use of radiation to treat the rectal area.  This condition is most commonly treated with surgery.  Follow your health care provider's instructions about taking medicines, eating and drinking, or taking sitz baths.  Call your health care provider if you have more pain, swelling, or blood. Get help right away if you have fever, severe pain, or problems passing urine or stool. This information is not intended to replace advice given to you by your health care provider. Make sure you discuss any questions you have with your health care provider. Document Revised: 06/15/2017 Document Reviewed: 06/15/2017 Elsevier Patient Education  2020 [employer]   Anal Fistulotomy  Anal fistulotomy is a surgical procedure to open and drain an anal fistula. An anal fistula is an abnormal tunnel that develops between the bowel and the skin near the outside of the anus, where stool (feces) comes out. During this procedure, the fistula is opened up and the contents are drained to promote healing. Tell a health care provider about:  Any allergies you have.  All medicines you are taking, including vitamins, herbs, eye drops, creams, and over-the-counter medicines.  Any problems you or family members have had with anesthetic medicines.  Any blood disorders you have.  Any surgeries you have had.  Any  medical conditions you have.  Any problems you have controlling your bowel movements (incontinence).  Whether you are pregnant or may be pregnant. What are the risks? Generally, this is a safe procedure. However, problems may occur, including:  Infection.  Bleeding.  Allergic reactions to medicines or dyes.  Damage to nearby structures or organs.  Incontinence or leaking of stool.  Being unable to empty your bladder (urinary retention).  Needing more surgery, if the fistula returns. Medicines Ask your health care provider about:  Changing or stopping your regular medicines. This is especially important if you are taking diabetes medicines or blood thinners.  Taking medicines such as aspirin and ibuprofen. These medicines can thin your blood. Do not take these medicines unless your health care provider tells you to take them.  Taking over-the-counter medicines, vitamins, herbs, and supplements. Surgery safety Ask your health care provider:  How your surgery site will be marked.  What steps will be taken to help prevent infection. These may include: ? Removing hair at the surgery site. ? Washing skin with a germ-killing soap. ?  Taking antibiotic medicine. General instructions  Do not use any products that contain nicotine or tobacco for at least 4 weeks before the procedure. These products include cigarettes, e-cigarettes, and chewing tobacco. If you need help quitting, ask your health care provider.  You may have an exam or testing.  You may have a blood or urine sample taken.  You may be told to take a medicine that helps you have a bowel movement (laxative) or use an enema to clean your bowels before surgery.  Plan to have someone take you home from the hospital or clinic.  Plan to have a responsible adult care for you for at least 24 hours after you leave the hospital or clinic. This is important. What happens during the procedure?  An IV will be inserted into  one of your veins.  You will be given one or more of the following: ? A medicine to help you relax (sedative). ? A medicine to numb the area (local anesthetic). ? A medicine to make you fall asleep (general anesthetic).  Your surgeon will locate the internal opening of your fistula.  An incision will be made in the fistula opening. The incision may extend into the muscles around the anus (sphincter muscles).  The fistula will be cut open and drained.  Gauze bandages (dressings) may be placed inside of the fistula. The procedure may vary among health care providers and hospitals. What happens after the procedure?   Your blood pressure, heart rate, breathing rate, and blood oxygen level will be monitored until you leave the hospital or clinic.  Do not drive for 24 hours if you were given a sedative during your procedure.  You may continue to receive fluids and medicines through an IV.  You may have some bleeding from your incision. You may have to wear a pad to absorb blood. Summary  Anal fistulotomy is a surgical procedure to open and drain an anal fistula, which is an abnormal tunnel that develops between the bowel and the skin near the outside of the anus.  Before the procedure, tell your health care provider about any medical problems you have or have had, including problems controlling bowel movements.  You will be told what to eat and drink before the procedure, and what medicines to change or stop. Follow these instructions carefully.  This is a safe procedure. However, problems may occur, including incontinence or leaking of stool and being unable to empty your bladder (urinary retention).  You may have some bleeding from your incision after the procedure. You may have to wear a pad to absorb blood. This information is not intended to replace advice given to you by your health care provider. Make sure you discuss any questions you have with your health care  provider. Document Revised: 07/15/2018 Document Reviewed: 07/15/2018 Elsevier Patient Education  2020 [employer]   
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
## 2019-05-26 ENCOUNTER — Encounter (HOSPITAL_COMMUNITY)
Admission: RE | Admit: 2019-05-26 | Discharge: 2019-05-26 | Disposition: A | Discharge status: Home / Self Care | Payer: Self-pay | Source: Ambulatory Visit | Attending: General Surgery | Admitting: General Surgery

## 2019-05-26 ENCOUNTER — Other Ambulatory Visit (HOSPITAL_COMMUNITY)
Admission: RE | Admit: 2019-05-26 | Discharge: 2019-05-26 | Disposition: A | Discharge status: Home / Self Care | Payer: HRSA Program | Source: Ambulatory Visit | Attending: General Surgery | Admitting: General Surgery

## 2019-05-26 ENCOUNTER — Other Ambulatory Visit: Payer: Self-pay

## 2019-05-26 DIAGNOSIS — Z20822 Contact with and (suspected) exposure to covid-19: Secondary | ICD-10-CM | POA: Insufficient documentation

## 2019-05-26 DIAGNOSIS — Z01812 Encounter for preprocedural laboratory examination: Secondary | ICD-10-CM | POA: Insufficient documentation

## 2019-05-26 LAB — SARS CORONAVIRUS 2 (TAT 6-24 HRS): SARS Coronavirus 2: NEGATIVE

## 2019-05-28 ENCOUNTER — Encounter (HOSPITAL_COMMUNITY): Payer: Self-pay | Admitting: General Surgery

## 2019-05-28 ENCOUNTER — Ambulatory Visit (HOSPITAL_COMMUNITY): Payer: Self-pay | Admitting: Anesthesiology

## 2019-05-28 ENCOUNTER — Ambulatory Visit (HOSPITAL_COMMUNITY)
Admission: RE | Admit: 2019-05-28 | Discharge: 2019-05-28 | Disposition: A | Discharge status: Home / Self Care | Payer: Self-pay | Attending: General Surgery | Admitting: General Surgery

## 2019-05-28 ENCOUNTER — Other Ambulatory Visit: Payer: Self-pay

## 2019-05-28 ENCOUNTER — Encounter (HOSPITAL_COMMUNITY)
Admission: RE | Disposition: A | Discharge status: Home / Self Care | Payer: Self-pay | Source: Home / Self Care | Attending: General Surgery

## 2019-05-28 DIAGNOSIS — Z79899 Other long term (current) drug therapy: Secondary | ICD-10-CM | POA: Insufficient documentation

## 2019-05-28 DIAGNOSIS — M1990 Unspecified osteoarthritis, unspecified site: Secondary | ICD-10-CM | POA: Insufficient documentation

## 2019-05-28 DIAGNOSIS — K603 Anal fistula, unspecified: Secondary | ICD-10-CM | POA: Diagnosis present

## 2019-05-28 DIAGNOSIS — Z7289 Other problems related to lifestyle: Secondary | ICD-10-CM | POA: Insufficient documentation

## 2019-05-28 DIAGNOSIS — I10 Essential (primary) hypertension: Secondary | ICD-10-CM | POA: Insufficient documentation

## 2019-05-28 DIAGNOSIS — F1490 Cocaine use, unspecified, uncomplicated: Secondary | ICD-10-CM | POA: Insufficient documentation

## 2019-05-28 DIAGNOSIS — F17210 Nicotine dependence, cigarettes, uncomplicated: Secondary | ICD-10-CM | POA: Insufficient documentation

## 2019-05-28 HISTORY — PX: ANAL FISTULOTOMY: SHX6423

## 2019-05-28 LAB — RAPID URINE DRUG SCREEN, HOSP PERFORMED
Amphetamines: NOT DETECTED
Barbiturates: NOT DETECTED
Benzodiazepines: NOT DETECTED
Cocaine: POSITIVE — AB
Opiates: NOT DETECTED
Tetrahydrocannabinol: NOT DETECTED

## 2019-05-28 SURGERY — ANAL FISTULOTOMY
Anesthesia: General

## 2019-05-28 MED ORDER — OXYCODONE HCL 5 MG PO TABS: 5.0000 mg | ORAL_TABLET | ORAL | 0 refills | Status: DC | PRN

## 2019-05-28 MED ORDER — LACTATED RINGERS IV SOLN
Freq: Once | INTRAVENOUS | Status: AC
  Administered 2019-05-28: 1000 mL via INTRAVENOUS

## 2019-05-28 MED ORDER — FENTANYL CITRATE (PF) 100 MCG/2ML IJ SOLN
INTRAMUSCULAR | Status: AC
  Filled 2019-05-28: qty 2

## 2019-05-28 MED ORDER — PROPOFOL 10 MG/ML IV BOLUS
INTRAVENOUS | Status: AC
  Filled 2019-05-28: qty 20

## 2019-05-28 MED ORDER — LIDOCAINE VISCOUS HCL 2 % MT SOLN
OROMUCOSAL | Status: DC | PRN
  Administered 2019-05-28: 15 mL via OROMUCOSAL

## 2019-05-28 MED ORDER — METHYLENE BLUE 0.5 % INJ SOLN
INTRAVENOUS | Status: AC
  Filled 2019-05-28: qty 10

## 2019-05-28 MED ORDER — GLYCOPYRROLATE 0.2 MG/ML IJ SOLN
INTRAMUSCULAR | Status: AC
  Filled 2019-05-28: qty 1

## 2019-05-28 MED ORDER — MIDAZOLAM HCL 2 MG/2ML IJ SOLN
INTRAMUSCULAR | Status: AC
  Filled 2019-05-28: qty 2

## 2019-05-28 MED ORDER — MIDAZOLAM HCL 5 MG/5ML IJ SOLN
INTRAMUSCULAR | Status: DC | PRN
  Administered 2019-05-28: 2 mg via INTRAVENOUS

## 2019-05-28 MED ORDER — BUPIVACAINE LIPOSOME 1.3 % IJ SUSP
INTRAMUSCULAR | Status: DC | PRN
  Administered 2019-05-28: 20 mL

## 2019-05-28 MED ORDER — ONDANSETRON HCL 4 MG/2ML IJ SOLN
INTRAMUSCULAR | Status: DC | PRN
  Administered 2019-05-28: 4 mg via INTRAVENOUS

## 2019-05-28 MED ORDER — LACTATED RINGERS IV SOLN: INTRAVENOUS | Status: DC | PRN

## 2019-05-28 MED ORDER — FENTANYL CITRATE (PF) 100 MCG/2ML IJ SOLN
INTRAMUSCULAR | Status: DC | PRN
  Administered 2019-05-28 (×2): 100 ug via INTRAVENOUS

## 2019-05-28 MED ORDER — DIPHENHYDRAMINE HCL 50 MG/ML IJ SOLN
INTRAMUSCULAR | Status: DC | PRN
  Administered 2019-05-28: 12.5 mg via INTRAVENOUS

## 2019-05-28 MED ORDER — BUPIVACAINE LIPOSOME 1.3 % IJ SUSP
INTRAMUSCULAR | Status: AC
  Filled 2019-05-28: qty 20

## 2019-05-28 MED ORDER — CHLORHEXIDINE GLUCONATE CLOTH 2 % EX PADS: 6.0000 | MEDICATED_PAD | Freq: Once | CUTANEOUS | Status: DC

## 2019-05-28 MED ORDER — LIDOCAINE HCL (CARDIAC) PF 100 MG/5ML IV SOSY
PREFILLED_SYRINGE | INTRAVENOUS | Status: DC | PRN
  Administered 2019-05-28: 60 mg via INTRAVENOUS

## 2019-05-28 MED ORDER — SODIUM CHLORIDE 0.9 % IR SOLN
Status: DC | PRN
  Administered 2019-05-28: 1000 mL

## 2019-05-28 MED ORDER — PROMETHAZINE HCL 25 MG/ML IJ SOLN: 6.2500 mg | INTRAMUSCULAR | Status: DC | PRN

## 2019-05-28 MED ORDER — FENTANYL CITRATE (PF) 100 MCG/2ML IJ SOLN: 25.0000 ug | INTRAMUSCULAR | Status: DC | PRN

## 2019-05-28 MED ORDER — SODIUM CHLORIDE 0.9 % IV SOLN
2.0000 g | INTRAVENOUS | Status: AC
  Administered 2019-05-28: 2 g via INTRAVENOUS
  Filled 2019-05-28: qty 2

## 2019-05-28 MED ORDER — LIDOCAINE VISCOUS HCL 2 % MT SOLN
OROMUCOSAL | Status: AC
  Filled 2019-05-28: qty 15

## 2019-05-28 MED ORDER — GLYCOPYRROLATE 0.2 MG/ML IJ SOLN
0.2000 mg | Freq: Once | INTRAMUSCULAR | Status: AC
  Administered 2019-05-28: 0.2 mg via INTRAVENOUS

## 2019-05-28 MED ORDER — PROPOFOL 10 MG/ML IV BOLUS
INTRAVENOUS | Status: DC | PRN
  Administered 2019-05-28: 300 mg via INTRAVENOUS

## 2019-05-28 MED ORDER — DIPHENHYDRAMINE HCL 50 MG/ML IJ SOLN
INTRAMUSCULAR | Status: AC
  Filled 2019-05-28: qty 1

## 2019-05-28 MED ORDER — ONDANSETRON HCL 4 MG/2ML IJ SOLN
INTRAMUSCULAR | Status: AC
  Filled 2019-05-28: qty 2

## 2019-05-28 MED ORDER — DEXAMETHASONE SODIUM PHOSPHATE 10 MG/ML IJ SOLN
INTRAMUSCULAR | Status: DC | PRN
  Administered 2019-05-28: 5 mg via INTRAVENOUS

## 2019-05-28 MED ORDER — MEPERIDINE HCL 50 MG/ML IJ SOLN: 6.2500 mg | INTRAMUSCULAR | Status: DC | PRN

## 2019-05-28 SURGICAL SUPPLY — 30 items
BAG HAMPER (MISCELLANEOUS) ×3 IMPLANT
CLOTH BEACON ORANGE TIMEOUT ST (SAFETY) ×3 IMPLANT
COVER LIGHT HANDLE STERIS (MISCELLANEOUS) ×6 IMPLANT
COVER WAND RF STERILE (DRAPES) ×3 IMPLANT
ELECT REM PT RETURN 9FT ADLT (ELECTROSURGICAL) ×3
ELECTRODE REM PT RTRN 9FT ADLT (ELECTROSURGICAL) ×1 IMPLANT
GAUZE SPONGE 4X4 12PLY STRL (GAUZE/BANDAGES/DRESSINGS) ×3 IMPLANT
GLOVE BIOGEL M 6.5 STRL (GLOVE) ×3 IMPLANT
GLOVE BIOGEL M 7.0 STRL (GLOVE) ×3 IMPLANT
GLOVE BIOGEL PI IND STRL 6.5 (GLOVE) ×2 IMPLANT
GLOVE BIOGEL PI IND STRL 7.0 (GLOVE) ×2 IMPLANT
GLOVE BIOGEL PI IND STRL 7.5 (GLOVE) ×1 IMPLANT
GLOVE BIOGEL PI INDICATOR 6.5 (GLOVE) ×4
GLOVE BIOGEL PI INDICATOR 7.0 (GLOVE) ×4
GLOVE BIOGEL PI INDICATOR 7.5 (GLOVE) ×2
GOWN STRL REUS W/TWL LRG LVL3 (GOWN DISPOSABLE) ×6 IMPLANT
HEMOSTAT SURGICEL 4X8 (HEMOSTASIS) ×6 IMPLANT
KIT TURNOVER CYSTO (KITS) ×3 IMPLANT
MANIFOLD NEPTUNE II (INSTRUMENTS) ×3 IMPLANT
NEEDLE HYPO 22GX1.5 SAFETY (NEEDLE) ×3 IMPLANT
NS IRRIG 1000ML POUR BTL (IV SOLUTION) ×3 IMPLANT
PACK PERI GYN (CUSTOM PROCEDURE TRAY) ×3 IMPLANT
PAD ARMBOARD 7.5X6 YLW CONV (MISCELLANEOUS) ×3 IMPLANT
SET BASIN LINEN APH (SET/KITS/TRAYS/PACK) ×3 IMPLANT
SPONGE SURGIFOAM ABS GEL 100 (HEMOSTASIS) ×3 IMPLANT
SURGILUBE 2OZ TUBE FLIPTOP (MISCELLANEOUS) ×3 IMPLANT
SUT SILK 0 FSL (SUTURE) ×3 IMPLANT
SUT VIC AB 2-0 CT2 27 (SUTURE) IMPLANT
SYR 20ML LL LF (SYRINGE) ×6 IMPLANT
SYR 30ML LL (SYRINGE) ×3 IMPLANT

## 2019-05-28 NOTE — Discharge Instructions (Signed)
Post Operative Instructions after fistulotomy  -Keep your stools soft and have a BM daily. Take fiber (Metamucil) over the counter daily. Take Colace twice daily if fiber does not keep your stools soft.  Take Miralax as needed if you still have not had a BM in 2 Days. -You may have some drainage and leakage of stool immediately after the procedure and this will get better with time.  -Take Sitz Baths (warm water baths) after every BM and as needed throughout the day when having discomfort.  -Take Ibuprofen and Tylenol alternating and the Narcotic pain medication for breakthrough pain. -Some bleeding is normal, but if you have excessive bleeding, fevers, chills, or more pain than prior, call or go to the ED. -Wear a pad for drainage as needed.    Anal Fistulotomy, Care After This sheet gives you information about how to care for yourself after your procedure. Your health care provider may also give you more specific instructions. If you have problems or questions, contact your health care provider. What can I expect after the procedure? After the procedure, it is common to have:  Some pain, discomfort, and swelling.  Increased pain during bowel movements.  Some bleeding from the incision area.  Some leakage of stool. Follow these instructions at home: Medicines  Take over-the-counter and prescription medicines only as told by your health care provider.  Ask your health care provider if the medicine prescribed to you requires you to avoid driving or using heavy machinery. Incision care  Keep the incision area clean and dry. Do Sitz baths as needed.   Check your incision area every day for signs of infection. Check for: ? More redness, swelling, or pain. ? More fluid or blood. ? Warmth. ? Pus or a bad smell. Self-care  After a bowel movement, clean the incision area using one of the following methods: ? Gently wipe with a moist towelette. ? Gently wipe with mild soap and  water. ? Take a shower. ? Take a sitz bath. This is a shallow, warm-water bath that attaches to the toilet bowl. You can also sit in a bathtub filled with warm water.  Do not swim or use a hot tub until your health care provider approves.  To reduce discomfort, you may apply ice to the incision area. To do this: ? Put ice in a plastic bag. ? Place a towel between your skin and the bag. ? Leave the ice on for 20 minutes, 2-3 times a day. Activity   Rest as told by your health care provider.  Avoid sitting for a long time without moving. Get up to take short walks every 1-2 hours. This is important to improve blood flow and breathing. Ask for help if you feel weak or unsteady.  Do not drive for 24 hours if you were given a sedative during your procedure.  Do not lift anything that is heavier than 10 lb (4.5 kg), or the limit that you are told, until your health care provider says that it is safe.  Return to your normal activities as told by your health care provider. Ask your health care provider what activities are safe for you. Eating and drinking  Follow instructions from your health care provider about eating or drinking restrictions.  You may need to take these actions to prevent or treat constipation: ? Drink enough fluid to keep your urine pale yellow. ? Eat foods that are high in fiber, such as beans, whole grains, and fresh fruits and  vegetables. ? Limit foods that are high in fat and processed sugars, such as fried or sweet foods. Lifestyle  Do not use any products that contain nicotine or tobacco, such as cigarettes, e-cigarettes, and chewing tobacco. If you need help quitting, ask your health care provider.  Do not drink alcohol if: ? Your health care provider tells you not to drink. ? You are pregnant, may be pregnant, or are planning to become pregnant.  If you drink alcohol: ? Limit how much you use to:  0-1 drink a day for women.  0-2 drinks a day for  men. ? Be aware of how much alcohol is in your drink. In the [place] one drink equals one 12 oz bottle of beer (355 mL), one 5 oz glass of wine (148 mL), or one 1 oz glass of hard liquor (44 mL). General instructions  If you have bleeding from the incision area, wear a pad to absorb blood. Change it often.  Keep all follow-up visits as told by your health care provider. This is important. Contact a health care provider if:  You have any of these signs of infection: ? More redness, swelling, or pain around your incision area. ? Warmth coming from your incision area, or your incision area is firm. ? Pus or a bad smell coming from your incision area. ? A fever or chills.  You develop swelling or tenderness in your groin area.  You cannot control your bowel movements (incontinence), or you are leaking stool.  You have trouble urinating.  You have pain that does not get better with medicine. Get help right away if:  You have severe pain in your abdomen or incision area.  You have sudden chest pain.  You become weak or you faint.  You have more fluid or blood coming from your incision.  You have bleeding from your incision that soaks 2 or more pads during 24 hours. Summary  After anal fistulotomy, it is common to have increased pain during bowel movements and some bleeding.  If a dressing was placed in your incision during surgery, remove it as told by your health care provider.  It is important to keep the incision area clean and dry after each bowel movement.  If you have bleeding from the incision area, wear a pad to absorb blood. Change it often. This information is not intended to replace advice given to you by your health care provider. Make sure you discuss any questions you have with your health care provider. Document Revised: 07/15/2018 Document Reviewed: 07/15/2018 Elsevier Patient Education  2020 [employer]vier Inc.     General Anesthesia, Adult, Care After This  sheet gives you information about how to care for yourself after your procedure. Your health care provider may also give you more specific instructions. If you have problems or questions, contact your health care provider. What can I expect after the procedure? After the procedure, the following side effects are common:  Pain or discomfort at the IV site.  Nausea.  Vomiting.  Sore throat.  Trouble concentrating.  Feeling cold or chills.  Weak or tired.  Sleepiness and fatigue.  Soreness and body aches. These side effects can affect parts of the body that were not involved in surgery. Follow these instructions at home:  For at least 24 hours after the procedure:  Have a responsible adult stay with you. It is important to have someone help care for you until you are awake and alert.  Rest as needed.  Do not: ? Participate in activities in which you could fall or become injured. ? Drive. ? Use heavy machinery. ? Drink alcohol. ? Take sleeping pills or medicines that cause drowsiness. ? Make important decisions or sign legal documents. ? Take care of children on your own. Eating and drinking  Follow any instructions from your health care provider about eating or drinking restrictions.  When you feel hungry, start by eating small amounts of foods that are soft and easy to digest (bland), such as toast. Gradually return to your regular diet.  Drink enough fluid to keep your urine pale yellow.  If you vomit, rehydrate by drinking water, juice, or clear broth. General instructions  If you have sleep apnea, surgery and certain medicines can increase your risk for breathing problems. Follow instructions from your health care provider about wearing your sleep device: ? Anytime you are sleeping, including during daytime naps. ? While taking prescription pain medicines, sleeping medicines, or medicines that make you drowsy.  Return to your normal activities as told by your  health care provider. Ask your health care provider what activities are safe for you.  Take over-the-counter and prescription medicines only as told by your health care provider.  If you smoke, do not smoke without supervision.  Keep all follow-up visits as told by your health care provider. This is important. Contact a health care provider if:  You have nausea or vomiting that does not get better with medicine.  You cannot eat or drink without vomiting.  You have pain that does not get better with medicine.  You are unable to pass urine.  You develop a skin rash.  You have a fever.  You have redness around your IV site that gets worse. Get help right away if:  You have difficulty breathing.  You have chest pain.  You have blood in your urine or stool, or you vomit blood. Summary  After the procedure, it is common to have a sore throat or nausea. It is also common to feel tired.  Have a responsible adult stay with you for the first 24 hours after general anesthesia. It is important to have someone help care for you until you are awake and alert.  When you feel hungry, start by eating small amounts of foods that are soft and easy to digest (bland), such as toast. Gradually return to your regular diet.  Drink enough fluid to keep your urine pale yellow.  Return to your normal activities as told by your health care provider. Ask your health care provider what activities are safe for you. This information is not intended to replace advice given to you by your health care provider. Make sure you discuss any questions you have with your health care provider. Document Revised: 02/02/2017 Document Reviewed: 09/15/2016 Elsevier Patient Education  2020 [employer].

## 2019-05-28 NOTE — Anesthesia Preprocedure Evaluation (Addendum)
Anesthesia Evaluation  Patient identified by MRN, date of birth, ID band Patient awake  General Assessment Comment:Water at 8:30am  Reviewed: Allergy & Precautions, NPO status , Patient's Chart, lab work & pertinent test results, reviewed documented beta blocker date and time   History of Anesthesia Complications Negative for: history of anesthetic complications  Airway Mallampati: II  TM Distance: >3 FB Neck ROM: Full    Dental  (+) Teeth Intact, Dental Advisory Given, Chipped,    Pulmonary Current SmokerPatient did not abstain from smoking.,    Pulmonary exam normal breath sounds clear to auscultation       Cardiovascular Exercise Tolerance: Good Hypertension: bet blockers in hospital, not at home. Normal cardiovascular exam Rhythm:Regular Rate:Normal  29-Mar-2019 09:30:14 [facility] ROUTINE RECORD Normal sinus rhythm Normal ECG Repeat EKG today - no significant new changes   Neuro/Psych negative neurological ROS  negative psych ROS   GI/Hepatic negative GI ROS, (+)     substance abuse (cocaine use 2days ago, currently drinks 5 to 6 drinks/day)  alcohol use and cocaine use,   Endo/Other  negative endocrine ROS  Renal/GU negative Renal ROS     Musculoskeletal  (+) Arthritis , Osteoarthritis,    Abdominal   Peds  Hematology negative hematology ROS (+)   Anesthesia Other Findings   Reproductive/Obstetrics                           Anesthesia Physical  Anesthesia Plan  ASA: III and emergent  Anesthesia Plan: General   Post-op Pain Management:    Induction: Intravenous  PONV Risk Score and Plan: 2 and Ondansetron, Dexamethasone, Midazolam and Metaclopromide  Airway Management Planned: LMA  Additional Equipment:   Intra-op Plan:   Post-operative Plan: Extubation in OR  Informed Consent: I have reviewed the patients History and Physical, chart, labs and  discussed the procedure including the risks, benefits and alternatives for the proposed anesthesia with the patient or authorized representative who has indicated his/her understanding and acceptance.     Dental advisory given  Plan Discussed with: Surgeon and CRNA  Anesthesia Plan Comments:        Anesthesia Quick Evaluation

## 2019-05-28 NOTE — Progress Notes (Signed)
[facility]  Surgery completed. Called daughter to let her know. Patient with some Roxicodone at home but only 10 prescribed on 4/6, so did send in refill given fistulotomy.   Will see next week. Sitz baths.   Algis Greenhouse, MD [facility] [address] R[address] [place], [place] [address] [phone] (office)

## 2019-05-28 NOTE — Op Note (Addendum)
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 Henreitta Leber, MD   Assistants: No qualified resident was available    Anesthesia: General endotracheal   Anesthesiologist: Molli Barrows, MD    Specimens:  None    Estimated Blood Loss: Minimal   Blood Replacement: None    Complications: None   Wound Class: Dirty   Operative Indications:  Mr. 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 He has been healing from the abscess and drainage. His seton has been in place and the inflammation has been decreasing. We discussed an exam under anesthesia to determine if a fistulotomy would be possible versus needing another seton and plans for a more advance procedure. We discussed the risk of surgery including but not limited to bleeding, infection, injury to the sphincter complex, incontinence, need for further surgery, and he opted to proceed.   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 General endotracheal anesthesia was induced. He was then placed in lithotomy position with all pressure points padded. Intravenous antibiotics were administered given his history of horseshoe abscess. The perineum was prepared and draped in the usual sterile fashion.   A digital rectal exam was performed and no masses were noted. The posterior seton was in place. The drainage sites from the horseshoe abscess were healed. There was no signs of infection or inflammation. An anal retractor was placed, the internal and external openings of the fistula were identified with the seton. The  external opening was opened up with a scalpel slightly to allow for better identification of the fistula. With a combination of palpation and retraction and movement of the seton, the fistula was found to be intersphincteric in nature. There was less than 30% of the internal sphincter involved, well below the dentate line and no external sphincter. The fistula track was laid up with electrocautery using the seton as a guide. The track was completely unroofed.  The track was cauterized and hemostasis confirmed. Gelfoam and Surgicel soaked in lidocaine gel and tagged with a silk suture was placed in the anal canal for hemostasis. This will be removed in the PACU. Xparel was placed.   All counts were correct at the end of the case. The patient was awakened from anesthesia and extubated without complication.  The patient went to the PACU in stable condition.   Algis Greenhouse, MD [facility] [address] [place], [place] [address] [phone] (office)

## 2019-05-28 NOTE — Anesthesia Postprocedure Evaluation (Signed)
Anesthesia Post Note  Patient: Frederick Roth  Procedure(s) Performed: ANAL FISTULOTOMY (N/A )  Patient location during evaluation: PACU Anesthesia Type: General Level of consciousness: sedated Pain management: pain level controlled Vital Signs Assessment: post-procedure vital signs reviewed and stable Respiratory status: spontaneous breathing, nonlabored ventilation and respiratory function stable Cardiovascular status: stable Postop Assessment: no apparent nausea or vomiting Anesthetic complications: no Comments: Patient with obstructed airway, and copious secretions, patient suctioned, respirations improving, Dr. Alva Garnet at bedside.     Last Vitals:  Vitals:   05/28/19 0936  BP: 128/87  Pulse: 76  Resp: 20  Temp: 36.8 C  SpO2: 97%    Last Pain:  Vitals:   05/28/19 0936  TempSrc: Oral  PainSc: 5                  Syona Wroblewski

## 2019-05-28 NOTE — Interval H&P Note (Signed)
History and Physical Interval Note:  05/28/2019 10:16 AM  Frederick Roth  has presented today for surgery, with the diagnosis of Fistula in ano.  The various methods of treatment have been discussed with the patient and family. After consideration of risks, benefits and other options for treatment, the patient has consented to  Procedure(s): ANAL FISTULOTOMY (N/A) as a surgical intervention.  The patient's history has been reviewed, patient examined, no change in status, stable for surgery.  I have reviewed the patient's chart and labs.  Questions were answered to the patient's satisfaction.    No changes.   [name]

## 2019-05-28 NOTE — Anesthesia Procedure Notes (Signed)
Procedure Name: LMA Insertion Date/Time: 05/28/2019 10:40 AM Performed by: Shanon Payor, CRNA Pre-anesthesia Checklist: Patient identified, Emergency Drugs available, Suction available, Timeout performed and Patient being monitored Patient Re-evaluated:Patient Re-evaluated prior to induction Oxygen Delivery Method: Circle system utilized Preoxygenation: Pre-oxygenation with 100% oxygen Induction Type: IV induction LMA: LMA inserted LMA Size: 4.0 Number of attempts: 1 Tube secured with: Tape Dental Injury: Teeth and Oropharynx as per pre-operative assessment

## 2019-05-28 NOTE — Transfer of Care (Signed)
Immediate Anesthesia Transfer of Care Note  Patient: Frederick Roth  Procedure(s) Performed: ANAL FISTULOTOMY (N/A )  Patient Location: PACU  Anesthesia Type:General  Level of Consciousness: awake and drowsy  Airway & Oxygen Therapy: Patient Spontanous Breathing and Patient connected to face mask oxygen  Post-op Assessment: Report given to RN and Post -op Vital signs reviewed and stable  Post vital signs: Reviewed and stable  Last Vitals:  Vitals Value Taken Time  BP 118/69 05/28/19 1118  Temp    Pulse 82 05/28/19 1129  Resp 16 05/28/19 1129  SpO2 100 % 05/28/19 1129  Vitals shown include unvalidated device data.  Last Pain:  Vitals:   05/28/19 0936  TempSrc: Oral  PainSc: 5       Patients Stated Pain Goal: 6 (05/28/19 0936)  Complications: No apparent anesthesia complications

## 2019-06-03 ENCOUNTER — Ambulatory Visit (INDEPENDENT_AMBULATORY_CARE_PROVIDER_SITE_OTHER): Payer: Self-pay | Admitting: General Surgery

## 2019-06-03 ENCOUNTER — Encounter: Payer: Self-pay | Admitting: General Surgery

## 2019-06-03 ENCOUNTER — Other Ambulatory Visit: Payer: Self-pay

## 2019-06-03 VITALS — BP 118/70 | HR 63 | Temp 97.7°F | Resp 12 | Ht 69.0 in | Wt 153.0 lb

## 2019-06-03 DIAGNOSIS — K644 Residual hemorrhoidal skin tags: Secondary | ICD-10-CM

## 2019-06-03 DIAGNOSIS — K603 Anal fistula: Secondary | ICD-10-CM

## 2019-06-03 MED ORDER — CYCLOBENZAPRINE HCL 5 MG PO TABS: 5.0000 mg | ORAL_TABLET | Freq: Three times a day (TID) | ORAL | 1 refills | Status: AC | PRN

## 2019-06-03 NOTE — Patient Instructions (Signed)
Continue sitz baths as needed. Keep stools regular. Use cream as needed as you have a small hemorrhoid right now.

## 2019-06-03 NOTE — Progress Notes (Signed)
[facility] Note   HPI:  54 y.o. Male presents to clinic for post-op follow-up evaluation after fistulotomy. He says he has some sharp pain on occasion and has been using some muscle relaxer to help. He is using minimal narcotic. He is having regular Bms and is doing the sitz baths. He reports some drainage on his pad/ stool on his pad after Bms but otherwise he has no leakage or incontinence concerns.   Review of Systems:  Drainage/ small degree stool on pad after BM Intermittent sharp spasm/ pain All other review of systems: otherwise negative   Vital Signs:  BP 118/70   Pulse 63   Temp 97.7 F (36.5 C) (Oral)   Resp 12   Ht 5\' 9"  (1.753 m)   Wt 153 lb (69.4 kg)   SpO2 96%   BMI 22.59 kg/m    Physical Exam:  Physical Exam Vitals reviewed.  HENT:     Head: Normocephalic.  Cardiovascular:     Rate and Rhythm: Normal rate.  Pulmonary:     Effort: Pulmonary effort is normal.  Genitourinary:    Comments: Posterior fistulotomy healing, swelling in the area, external hemorrhoid in the region, tender Musculoskeletal:        General: No swelling.  Skin:    General: Skin is warm.  Neurological:     Mental Status: He is alert.     Assessment:  54 y.o. yo Male s/p fistulotomy and is healing. He is overall doing well and keeping his stools soft and regular. He is maintaining his pain and is using minimal narcotics.  He does have what looks like swollen external hemorrhoid which is likely contributing to his pain.   Plan:  Continue sitz baths as needed. Keep stools regular. Use lidocaine cream as needed as you have a small hemorrhoid right now. Flexeril TID PRN prescribed   Future Appointments  Date Time Provider Department Center  06/24/2019 10:00 AM 08/24/2019, MD [facility] None    All of the above recommendations were discussed with the patient, and all of patient's questions were answered to his expressed satisfaction.  [name],  MD [facility] [address] [place], [place] [phone] (office)

## 2019-06-20 ENCOUNTER — Observation Stay (HOSPITAL_COMMUNITY)
Admission: EM | Admit: 2019-06-20 | Discharge: 2019-06-20 | Disposition: A | Discharge status: Home / Self Care | Payer: Self-pay | Attending: Family Medicine | Admitting: Family Medicine

## 2019-06-20 ENCOUNTER — Other Ambulatory Visit: Payer: Self-pay

## 2019-06-20 ENCOUNTER — Emergency Department (HOSPITAL_COMMUNITY): Payer: Self-pay

## 2019-06-20 ENCOUNTER — Observation Stay (HOSPITAL_COMMUNITY): Payer: Self-pay

## 2019-06-20 ENCOUNTER — Encounter (HOSPITAL_COMMUNITY): Payer: Self-pay | Admitting: Emergency Medicine

## 2019-06-20 DIAGNOSIS — R251 Tremor, unspecified: Secondary | ICD-10-CM | POA: Insufficient documentation

## 2019-06-20 DIAGNOSIS — F10929 Alcohol use, unspecified with intoxication, unspecified: Secondary | ICD-10-CM | POA: Diagnosis present

## 2019-06-20 DIAGNOSIS — F17200 Nicotine dependence, unspecified, uncomplicated: Secondary | ICD-10-CM

## 2019-06-20 DIAGNOSIS — F1410 Cocaine abuse, uncomplicated: Secondary | ICD-10-CM | POA: Diagnosis present

## 2019-06-20 DIAGNOSIS — Z20822 Contact with and (suspected) exposure to covid-19: Secondary | ICD-10-CM | POA: Insufficient documentation

## 2019-06-20 DIAGNOSIS — R4182 Altered mental status, unspecified: Secondary | ICD-10-CM | POA: Insufficient documentation

## 2019-06-20 DIAGNOSIS — F17210 Nicotine dependence, cigarettes, uncomplicated: Secondary | ICD-10-CM | POA: Insufficient documentation

## 2019-06-20 DIAGNOSIS — K603 Anal fistula: Secondary | ICD-10-CM | POA: Diagnosis present

## 2019-06-20 DIAGNOSIS — G9341 Metabolic encephalopathy: Principal | ICD-10-CM | POA: Diagnosis present

## 2019-06-20 LAB — RESPIRATORY PANEL BY RT PCR (FLU A&B, COVID)
Influenza A by PCR: NEGATIVE
Influenza B by PCR: NEGATIVE
SARS Coronavirus 2 by RT PCR: NEGATIVE

## 2019-06-20 LAB — RAPID URINE DRUG SCREEN, HOSP PERFORMED
Amphetamines: NOT DETECTED
Amphetamines: NOT DETECTED
Barbiturates: NOT DETECTED
Barbiturates: NOT DETECTED
Benzodiazepines: NOT DETECTED
Benzodiazepines: NOT DETECTED
Cocaine: NOT DETECTED
Cocaine: NOT DETECTED
Opiates: NOT DETECTED
Opiates: NOT DETECTED
Tetrahydrocannabinol: NOT DETECTED
Tetrahydrocannabinol: NOT DETECTED

## 2019-06-20 LAB — COMPREHENSIVE METABOLIC PANEL
ALT: 24 U/L (ref 0–44)
AST: 31 U/L (ref 15–41)
Albumin: 4.3 g/dL (ref 3.5–5.0)
Alkaline Phosphatase: 69 U/L (ref 38–126)
Anion gap: 14 (ref 5–15)
BUN: 8 mg/dL (ref 6–20)
CO2: 16 mmol/L — ABNORMAL LOW (ref 22–32)
Calcium: 8.8 mg/dL — ABNORMAL LOW (ref 8.9–10.3)
Chloride: 106 mmol/L (ref 98–111)
Creatinine, Ser: 0.81 mg/dL (ref 0.61–1.24)
GFR calc Af Amer: >60 mL/min (ref >60)
GFR calc non Af Amer: >60 mL/min (ref >60)
Glucose, Bld: 91 mg/dL (ref 70–99)
Potassium: 4.1 mmol/L (ref 3.5–5.1)
Sodium: 136 mmol/L (ref 135–145)
Total Bilirubin: 0.6 mg/dL (ref 0.3–1.2)
Total Protein: 8.8 g/dL — ABNORMAL HIGH (ref 6.5–8.1)

## 2019-06-20 LAB — ETHANOL: Alcohol, Ethyl (B): 243 mg/dL — ABNORMAL HIGH (ref <10)

## 2019-06-20 LAB — URINALYSIS, ROUTINE W REFLEX MICROSCOPIC
Bacteria, UA: NONE SEEN
Bilirubin Urine: NEGATIVE
Glucose, UA: NEGATIVE mg/dL
Ketones, ur: NEGATIVE mg/dL
Leukocytes,Ua: NEGATIVE
Nitrite: NEGATIVE
Protein, ur: NEGATIVE mg/dL
Specific Gravity, Urine: 1.003 — ABNORMAL LOW (ref 1.005–1.030)
pH: 5 (ref 5.0–8.0)

## 2019-06-20 LAB — CBC
HCT: 44.8 % (ref 39.0–52.0)
Hemoglobin: 15 g/dL (ref 13.0–17.0)
MCH: 31.3 pg (ref 26.0–34.0)
MCHC: 33.5 g/dL (ref 30.0–36.0)
MCV: 93.5 fL (ref 80.0–100.0)
Platelets: 296 10*3/uL (ref 150–400)
RBC: 4.79 MIL/uL (ref 4.22–5.81)
RDW: 14 % (ref 11.5–15.5)
WBC: 9.6 10*3/uL (ref 4.0–10.5)
nRBC: 0 % (ref 0.0–0.2)

## 2019-06-20 LAB — DIFFERENTIAL
Abs Immature Granulocytes: 0.04 10*3/uL (ref 0.00–0.07)
Basophils Absolute: 0.1 10*3/uL (ref 0.0–0.1)
Basophils Relative: 1 %
Eosinophils Absolute: 0.1 10*3/uL (ref 0.0–0.5)
Eosinophils Relative: 1 %
Immature Granulocytes: 0 %
Lymphocytes Relative: 36 %
Lymphs Abs: 3.5 10*3/uL (ref 0.7–4.0)
Monocytes Absolute: 0.9 10*3/uL (ref 0.1–1.0)
Monocytes Relative: 9 %
Neutro Abs: 5.1 10*3/uL (ref 1.7–7.7)
Neutrophils Relative %: 53 %

## 2019-06-20 LAB — SALICYLATE LEVEL: Salicylate Lvl: <7 mg/dL — ABNORMAL LOW (ref 7.0–30.0)

## 2019-06-20 LAB — PROTIME-INR
INR: 1 (ref 0.8–1.2)
Prothrombin Time: 12.7 seconds (ref 11.4–15.2)

## 2019-06-20 LAB — ACETAMINOPHEN LEVEL: Acetaminophen (Tylenol), Serum: <10 ug/mL — ABNORMAL LOW (ref 10–30)

## 2019-06-20 LAB — APTT: aPTT: 28 seconds (ref 24–36)

## 2019-06-20 MED ORDER — SODIUM CHLORIDE 0.9% FLUSH: 3.0000 mL | INTRAVENOUS | Status: DC | PRN

## 2019-06-20 MED ORDER — ASPIRIN 325 MG PO TABS
325.0000 mg | ORAL_TABLET | Freq: Once | ORAL | Status: AC
  Administered 2019-06-20: 325 mg via ORAL
  Filled 2019-06-20: qty 1

## 2019-06-20 MED ORDER — ONDANSETRON HCL 4 MG/2ML IJ SOLN: 4.0000 mg | Freq: Four times a day (QID) | INTRAMUSCULAR | Status: DC | PRN

## 2019-06-20 MED ORDER — SODIUM CHLORIDE 0.9 % IV SOLN: 250.0000 mL | INTRAVENOUS | Status: DC | PRN

## 2019-06-20 MED ORDER — ONDANSETRON HCL 4 MG PO TABS: 4.0000 mg | ORAL_TABLET | Freq: Four times a day (QID) | ORAL | Status: DC | PRN

## 2019-06-20 MED ORDER — HEPARIN SODIUM (PORCINE) 5000 UNIT/ML IJ SOLN: 5000.0000 [IU] | Freq: Three times a day (TID) | INTRAMUSCULAR | Status: DC

## 2019-06-20 MED ORDER — POLYETHYLENE GLYCOL 3350 17 G PO PACK: 17.0000 g | PACK | Freq: Every day | ORAL | Status: DC | PRN

## 2019-06-20 MED ORDER — SODIUM CHLORIDE 0.9% FLUSH: 3.0000 mL | Freq: Two times a day (BID) | INTRAVENOUS | Status: DC

## 2019-06-20 MED ORDER — TRAZODONE HCL 50 MG PO TABS: 50.0000 mg | ORAL_TABLET | Freq: Every evening | ORAL | Status: DC | PRN
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
## 2019-06-20 NOTE — ED Notes (Signed)
Per Dr. Marisa Severin, if patient's MRI is clear, he is going to be discharged home. Have given him NS at an hour for 2 hours, per verbal order.

## 2019-06-20 NOTE — ED Notes (Signed)
Patient taken to CT scan.

## 2019-06-20 NOTE — Progress Notes (Signed)
CSW has reviewed patients chart and notes that patient is without primary care and/or insurance. CSW will follow up with patient regarding this matter and provide assistance as needed.   Alicianna Litchford M. Sherryll Skoczylas LCSWA Transitions of Care  Clinical Social Worker  Ph: [phone] 

## 2019-06-20 NOTE — ED Notes (Signed)
Patient's wife has called numerous times this morning about receiving information on said patient. Patient will not answer phone in room, states that he does not want to talk with wife and does not want his information shared with her. Message was relayed to wife at this time.

## 2019-06-20 NOTE — Discharge Instructions (Signed)
1)You need to get help with your excessive use of alcohol--- consider AA meetings and other outpatient alcohol treatment programs 2)You are strongly, strongly advised against taking oxycodone or Flexeril while you are drinking alcohol 3) abstinence from tobacco advised

## 2019-06-20 NOTE — ED Triage Notes (Addendum)
Patient presents with altered mental status. Patient states that he took his muscle relaxers and pain pills. Family states that they have been with the patient and that he has drank beer all day today. Patient has not been able to give much information. Family states this is not patient's normal behavior. Patient presents with some seizure like aspects.

## 2019-06-20 NOTE — ED Notes (Signed)
Per Pts request, Pts daughter and Brother 'Frederick Roth' are only people to be informed of Pts medical status and treatment plan. Pt does not wish his wife Frederick Roth' to be informed of anything related to his care and treatment.
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
## 2019-06-20 NOTE — ED Notes (Signed)
Pt resting, but complaining of a headache. He is hungry. No hospitalist has signed up for patient yet.

## 2019-06-20 NOTE — ED Notes (Signed)
ED Provider at bedside. 

## 2019-06-24 ENCOUNTER — Encounter: Payer: Self-pay | Admitting: General Surgery

## 2019-06-24 ENCOUNTER — Other Ambulatory Visit: Payer: Self-pay

## 2019-06-24 ENCOUNTER — Ambulatory Visit (INDEPENDENT_AMBULATORY_CARE_PROVIDER_SITE_OTHER): Payer: Self-pay | Admitting: General Surgery

## 2019-06-24 VITALS — BP 134/81 | HR 60 | Temp 97.6°F | Resp 12 | Ht 69.0 in | Wt 152.0 lb

## 2019-06-24 DIAGNOSIS — K603 Anal fistula: Secondary | ICD-10-CM

## 2019-06-24 NOTE — Progress Notes (Signed)
[facility] Note   HPI:  54 y.o. Male presents to clinic for follow-up evaluation after fistulotomy. He is doing well overall. He still has some brownish drainage at night mostly every other day or so. He has regular soft Bms. No pain.   Review of Systems:  Drainage mostly at night after BM in the evening, some brownish fluid on pad  All other review of systems: otherwise negative   Vital Signs:  BP 134/81   Pulse 60   Temp 97.6 F (36.4 C) (Oral)   Resp 12   Ht 5\' 9"  (1.753 m)   Wt 152 lb (68.9 kg)   SpO2 94%   BMI 22.45 kg/m    Physical Exam:  Physical Exam Vitals reviewed.  HENT:     Head: Normocephalic.  Cardiovascular:     Rate and Rhythm: Normal rate.  Pulmonary:     Effort: Pulmonary effort is normal.  Genitourinary:    Comments: Healing fistulotomy site, nontender   Assessment:  54 y.o. yo Male s/p fistulotomy for ano fistula. Doing well overall. Having some drainage still. He is about 4 weeks out now. The drainage should continue to improve. Does not sound like incontinence just some minor leakage/ staining after Bm. Told him to try to wipe again before bed.   Plan:  Continue sitz baths and keeping your stools soft.   Future Appointments  Date Time Provider Department Center  07/15/2019  9:30 AM 09/14/2019, MD [facility] None    [name], MD [facility] [address] [place], [place] [phone] (office)

## 2019-06-24 NOTE — Patient Instructions (Signed)
Continue sitz baths and keeping your stools soft.

## 2019-07-07 ENCOUNTER — Telehealth: Payer: Self-pay | Admitting: Family Medicine

## 2019-07-07 MED ORDER — OXYCODONE HCL 5 MG PO TABS: 5.0000 mg | ORAL_TABLET | ORAL | 0 refills | Status: DC | PRN

## 2019-07-07 NOTE — Telephone Encounter (Signed)
Patient called and is requesting a refill on his Oxycodone:      LRF:     Patient Sig: Take 1 tablet (5 mg total) by mouth every 4 (four) hours as needed for severe pain or breakthrough pain.      Ordered on: 05/28/2019     Authorized by: [name]     Dispense: 15 tablet

## 2019-07-07 NOTE — Telephone Encounter (Signed)
[facility]  One additional refill given. This will be the last as he should be healing up and not requiring further narcotics.  Algis Greenhouse, MD [facility] [address] [place], [place] [address] [phone] (office)

## 2019-07-15 ENCOUNTER — Ambulatory Visit (INDEPENDENT_AMBULATORY_CARE_PROVIDER_SITE_OTHER): Payer: Self-pay | Admitting: General Surgery

## 2019-07-15 ENCOUNTER — Other Ambulatory Visit: Payer: Self-pay

## 2019-07-15 ENCOUNTER — Encounter: Payer: Self-pay | Admitting: General Surgery

## 2019-07-15 VITALS — BP 128/86 | HR 83 | Temp 98.4°F | Resp 12 | Ht 69.0 in | Wt 151.0 lb

## 2019-07-15 DIAGNOSIS — K603 Anal fistula: Secondary | ICD-10-CM

## 2019-07-15 NOTE — Progress Notes (Signed)
[facility] Note   HPI:  54 y.o. Male presents to clinic for post-op follow-up evaluation after his fistulotomy. He is doing well and says his drainage he was having at night is improved. He only notices any if he has a loose BM before bed. Otherwise he is well. Having regular bms. Some soreness.  .  Review of Systems:  Minor drainage on occasion Regular Bms  All other review of systems: otherwise negative   Vital Signs:  BP 128/86   Pulse 83   Temp 98.4 F (36.9 C) (Oral)   Resp 12   Ht 5\' 9"  (1.753 m)   Wt 151 lb (68.5 kg)   SpO2 93%   BMI 22.30 kg/m    Physical Exam:  Physical Exam Vitals reviewed.  HENT:     Head: Normocephalic.  Cardiovascular:     Rate and Rhythm: Normal rate.  Pulmonary:     Effort: Pulmonary effort is normal.  Genitourinary:    Comments: Healed fistulotomy site, minor skin tag    Assessment:  54 y.o. yo Male s/p fistulotomy doing well.  Plan:  - Had sent in last Roxicodone Rx last week, it is at [facility] to start back work  - Keep stools regular and soft, recommended high fiber diet/ metamucil  - Follow up PRN   All of the above recommendations were discussed with the patient and all of patient's questions were answered to his expressed satisfaction.  [facility], MD [facility] [address] [place], [place] [phone] (office)

## 2019-07-15 NOTE — Patient Instructions (Signed)
High fiber diet or take metamucil/ benefiber (over the counter) to keep stools soft and regular.   High-Fiber Diet Fiber, also called dietary fiber, is a type of carbohydrate that is found in fruits, vegetables, whole grains, and beans. A high-fiber diet can have many health benefits. Your health care provider may recommend a high-fiber diet to help:  Prevent constipation. Fiber can make your bowel movements more regular.  Lower your cholesterol.  Relieve the following conditions: ? Swelling of veins in the anus (hemorrhoids). ? Swelling and irritation (inflammation) of specific areas of the digestive tract (uncomplicated diverticulosis). ? A problem of the large intestine (colon) that sometimes causes pain and diarrhea (irritable bowel syndrome, IBS).  Prevent overeating as part of a weight-loss plan.  Prevent heart disease, type 2 diabetes, and certain cancers. What is my plan? The recommended daily fiber intake in grams (g) includes:  38 g for men age 56 or younger.  30 g for men over age 56.  25 g for women age 56 or younger.  21 g for women over age 56. You can get the recommended daily intake of dietary fiber by:  Eating a variety of fruits, vegetables, grains, and beans.  Taking a fiber supplement, if it is not possible to get enough fiber through your diet. What do I need to know about a high-fiber diet?  It is better to get fiber through food sources rather than from fiber supplements. There is not a lot of research about how effective supplements are.  Always check the fiber content on the nutrition facts label of any prepackaged food. Look for foods that contain 5 g of fiber or more per serving.  Talk with a diet and nutrition specialist (dietitian) if you have questions about specific foods that are recommended or not recommended for your medical condition, especially if those foods are not listed below.  Gradually increase how much fiber you consume. If you  increase your intake of dietary fiber too quickly, you may have bloating, cramping, or gas.  Drink plenty of water. Water helps you to digest fiber. What are tips for following this plan?  Eat a wide variety of high-fiber foods.  Make sure that half of the grains that you eat each day are whole grains.  Eat breads and cereals that are made with whole-grain flour instead of refined flour or white flour.  Eat brown rice, bulgur wheat, or millet instead of white rice.  Start the day with a breakfast that is high in fiber, such as a cereal that contains 5 g of fiber or more per serving.  Use beans in place of meat in soups, salads, and pasta dishes.  Eat high-fiber snacks, such as berries, raw vegetables, nuts, and popcorn.  Choose whole fruits and vegetables instead of processed forms like juice or sauce. What foods can I eat?  Fruits Berries. Pears. Apples. Oranges. Avocado. Prunes and raisins. Dried figs. Vegetables Sweet potatoes. Spinach. Kale. Artichokes. Cabbage. Broccoli. Cauliflower. Green peas. Carrots. Squash. Grains Whole-grain breads. Multigrain cereal. Oats and oatmeal. Brown rice. Barley. Bulgur wheat. Millet. Quinoa. Bran muffins. Popcorn. Rye wafer crackers. Meats and other proteins Navy, kidney, and pinto beans. Soybeans. Split peas. Lentils. Nuts and seeds. Dairy Fiber-fortified yogurt. Beverages Fiber-fortified soy milk. Fiber-fortified orange juice. Other foods Fiber bars. The items listed above may not be a complete list of recommended foods and beverages. Contact a dietitian for more options. What foods are not recommended? Fruits Fruit juice. Cooked, strained fruit. Vegetables [place]  potatoes. Canned vegetables. Well-cooked vegetables. Grains White bread. Pasta made with refined flour. White rice. Meats and other proteins Fatty cuts of meat. Fried chicken or fried fish. Dairy Milk. Yogurt. Cream cheese. Sour cream. Fats and  oils Butters. Beverages Soft drinks. Other foods Cakes and pastries. The items listed above may not be a complete list of foods and beverages to avoid. Contact a dietitian for more information. Summary  Fiber is a type of carbohydrate. It is found in fruits, vegetables, whole grains, and beans.  There are many health benefits of eating a high-fiber diet, such as preventing constipation, lowering blood cholesterol, helping with weight loss, and reducing your risk of heart disease, diabetes, and certain cancers.  Gradually increase your intake of fiber. Increasing too fast can result in cramping, bloating, and gas. Drink plenty of water while you increase your fiber.  The best sources of fiber include whole fruits and vegetables, whole grains, nuts, seeds, and beans. This information is not intended to replace advice given to you by your health care provider. Make sure you discuss any questions you have with your health care provider. Document Revised: 12/04/2016 Document Reviewed: 12/04/2016 Elsevier Patient Education  2020 [employer].

## 2019-09-25 ENCOUNTER — Other Ambulatory Visit: Payer: Self-pay

## 2019-09-25 ENCOUNTER — Encounter (HOSPITAL_COMMUNITY): Payer: Self-pay | Admitting: Emergency Medicine

## 2019-09-25 ENCOUNTER — Emergency Department (HOSPITAL_COMMUNITY)
Admission: EM | Admit: 2019-09-25 | Discharge: 2019-09-25 | Disposition: A | Discharge status: Home / Self Care | Payer: Self-pay | Attending: Emergency Medicine | Admitting: Emergency Medicine

## 2019-09-25 DIAGNOSIS — R7401 Elevation of levels of liver transaminase levels: Secondary | ICD-10-CM | POA: Insufficient documentation

## 2019-09-25 DIAGNOSIS — R17 Unspecified jaundice: Secondary | ICD-10-CM

## 2019-09-25 DIAGNOSIS — T675XXA Heat exhaustion, unspecified, initial encounter: Secondary | ICD-10-CM | POA: Insufficient documentation

## 2019-09-25 DIAGNOSIS — E871 Hypo-osmolality and hyponatremia: Secondary | ICD-10-CM | POA: Insufficient documentation

## 2019-09-25 DIAGNOSIS — R7303 Prediabetes: Secondary | ICD-10-CM | POA: Insufficient documentation

## 2019-09-25 DIAGNOSIS — N179 Acute kidney failure, unspecified: Secondary | ICD-10-CM | POA: Insufficient documentation

## 2019-09-25 DIAGNOSIS — F17210 Nicotine dependence, cigarettes, uncomplicated: Secondary | ICD-10-CM | POA: Insufficient documentation

## 2019-09-25 LAB — BASIC METABOLIC PANEL
Anion gap: 10 (ref 5–15)
BUN: 38 mg/dL — ABNORMAL HIGH (ref 6–20)
CO2: 23 mmol/L (ref 22–32)
Calcium: 8.7 mg/dL — ABNORMAL LOW (ref 8.9–10.3)
Chloride: 98 mmol/L (ref 98–111)
Creatinine, Ser: 1.75 mg/dL — ABNORMAL HIGH (ref 0.61–1.24)
GFR calc Af Amer: 49 mL/min — ABNORMAL LOW (ref >60)
GFR calc non Af Amer: 43 mL/min — ABNORMAL LOW (ref >60)
Glucose, Bld: 110 mg/dL — ABNORMAL HIGH (ref 70–99)
Potassium: 4.2 mmol/L (ref 3.5–5.1)
Sodium: 131 mmol/L — ABNORMAL LOW (ref 135–145)

## 2019-09-25 LAB — COMPREHENSIVE METABOLIC PANEL
ALT: 50 U/L — ABNORMAL HIGH (ref 0–44)
AST: 54 U/L — ABNORMAL HIGH (ref 15–41)
Albumin: 5.1 g/dL — ABNORMAL HIGH (ref 3.5–5.0)
Alkaline Phosphatase: 59 U/L (ref 38–126)
Anion gap: 17 — ABNORMAL HIGH (ref 5–15)
BUN: 45 mg/dL — ABNORMAL HIGH (ref 6–20)
CO2: 18 mmol/L — ABNORMAL LOW (ref 22–32)
Calcium: 9.6 mg/dL (ref 8.9–10.3)
Chloride: 96 mmol/L — ABNORMAL LOW (ref 98–111)
Creatinine, Ser: 2.53 mg/dL — ABNORMAL HIGH (ref 0.61–1.24)
GFR calc Af Amer: 32 mL/min — ABNORMAL LOW (ref >60)
GFR calc non Af Amer: 27 mL/min — ABNORMAL LOW (ref >60)
Glucose, Bld: 121 mg/dL — ABNORMAL HIGH (ref 70–99)
Potassium: 3.7 mmol/L (ref 3.5–5.1)
Sodium: 131 mmol/L — ABNORMAL LOW (ref 135–145)
Total Bilirubin: 1.6 mg/dL — ABNORMAL HIGH (ref 0.3–1.2)
Total Protein: 9.8 g/dL — ABNORMAL HIGH (ref 6.5–8.1)

## 2019-09-25 LAB — CBC
HCT: 47.7 % (ref 39.0–52.0)
Hemoglobin: 16.1 g/dL (ref 13.0–17.0)
MCH: 31.8 pg (ref 26.0–34.0)
MCHC: 33.8 g/dL (ref 30.0–36.0)
MCV: 94.3 fL (ref 80.0–100.0)
Platelets: 343 10*3/uL (ref 150–400)
RBC: 5.06 MIL/uL (ref 4.22–5.81)
RDW: 13 % (ref 11.5–15.5)
WBC: 9.6 10*3/uL (ref 4.0–10.5)
nRBC: 0 % (ref 0.0–0.2)

## 2019-09-25 LAB — MAGNESIUM: Magnesium: 3.5 mg/dL — ABNORMAL HIGH (ref 1.7–2.4)

## 2019-09-25 MED ORDER — ONDANSETRON HCL 4 MG/2ML IJ SOLN
4.0000 mg | Freq: Once | INTRAMUSCULAR | Status: AC
  Administered 2019-09-25: 4 mg via INTRAVENOUS
  Filled 2019-09-25: qty 2

## 2019-09-25 MED ORDER — POTASSIUM CHLORIDE CRYS ER 20 MEQ PO TBCR
40.0000 meq | EXTENDED_RELEASE_TABLET | Freq: Once | ORAL | Status: AC
  Administered 2019-09-25: 40 meq via ORAL
  Filled 2019-09-25: qty 2

## 2019-09-25 MED ORDER — LACTATED RINGERS IV BOLUS
1000.0000 mL | Freq: Once | INTRAVENOUS | Status: AC
  Administered 2019-09-25: 1000 mL via INTRAVENOUS

## 2019-09-25 MED ORDER — MORPHINE SULFATE (PF) 4 MG/ML IV SOLN
4.0000 mg | Freq: Once | INTRAVENOUS | Status: AC
  Administered 2019-09-25: 4 mg via INTRAVENOUS
  Filled 2019-09-25: qty 1
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
## 2019-09-25 NOTE — Discharge Instructions (Signed)
You were evaluated in the Emergency Department and after careful evaluation, we did not find any emergent condition requiring admission or further testing in the hospital.  Your symptoms seem to be due to heat exhaustion and this caused mild injury to your kidneys due to dehydration.  Your kidney function was improving with fluids here in the emergency department and you are safe for discharge home.  It is very important that you rest for the next 3 to 5 days and drink plenty of fluids to fully recover.  Please return to the Emergency Department if you experience any worsening of your condition.  Thank you for allowing [place] to be a part of your care.

## 2020-10-25 IMAGING — CT CT HEAD W/O CM
3 series · 15 of 47 positions shown, 18 images · non-contrast
Comparison: CT head without contrast 03/15/2012

CLINICAL DATA: Altered mental status. Seizure like activity.

EXAM:
CT HEAD WITHOUT CONTRAST
TECHNIQUE: Contiguous axial images were obtained from the base of the skull
through the vertex without intravenous contrast.

[Series 2: head w o · axial · 0.43mm/px · z∈[+1290,+1425]mm · 9 of 33 slices shown, 12 images]
[im 3/33  brain]
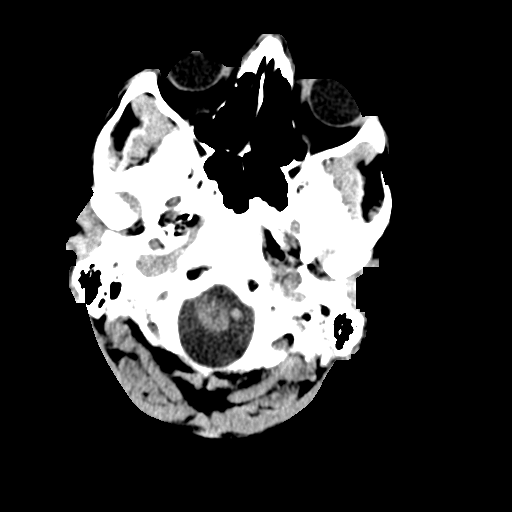
[im 3/33  bone]
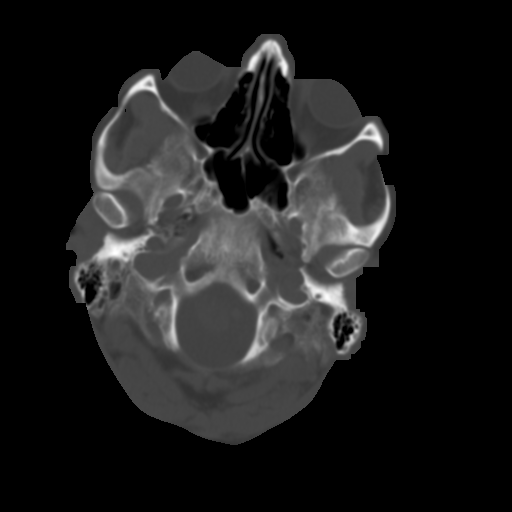
[im 6/33  brain]
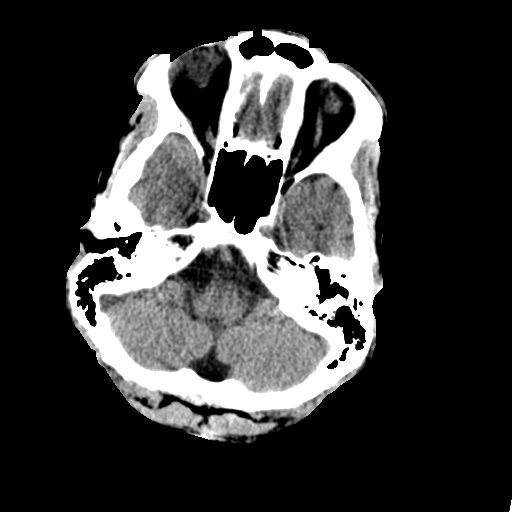
[im 9/33  brain]
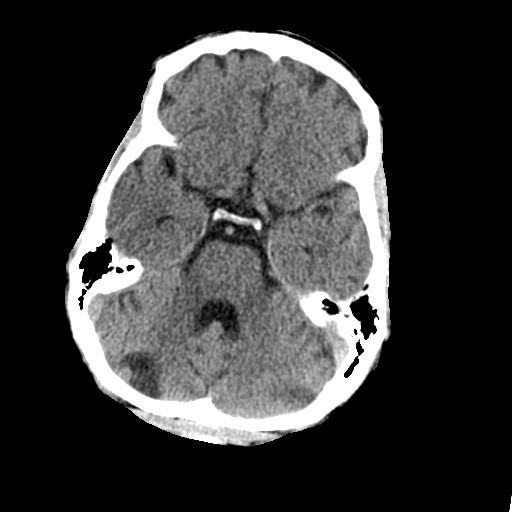
[im 13/33  brain]
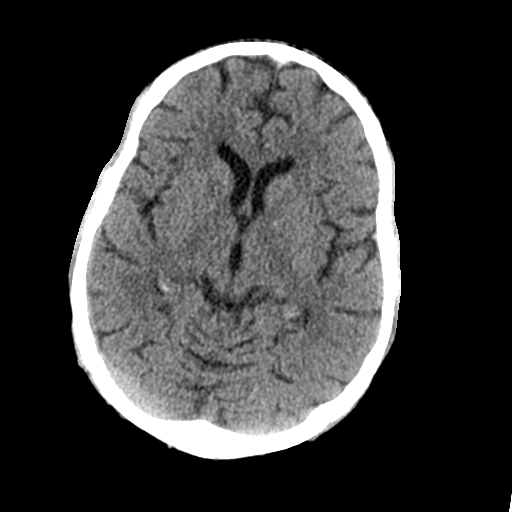
[im 17/33  brain]
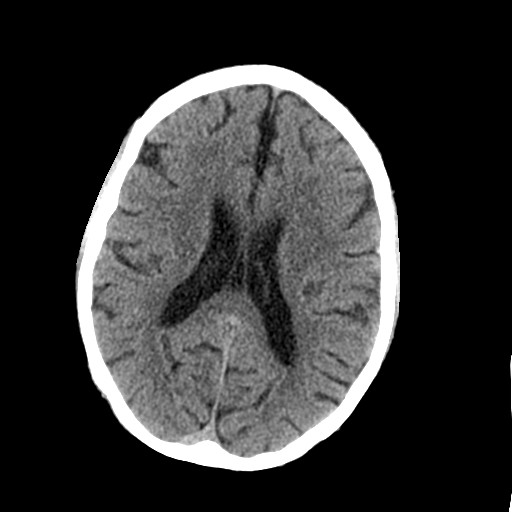
[im 17/33  bone]
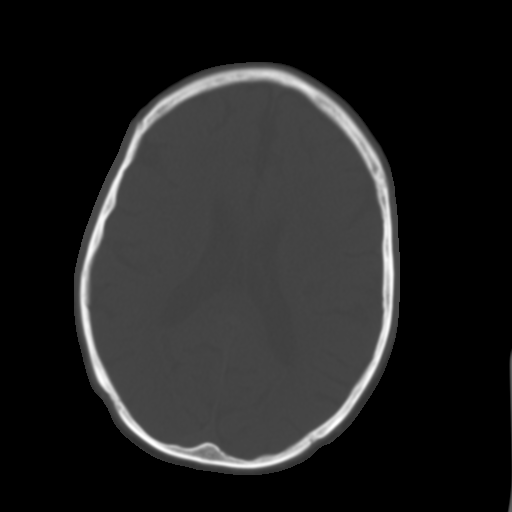
[im 20/33  brain]
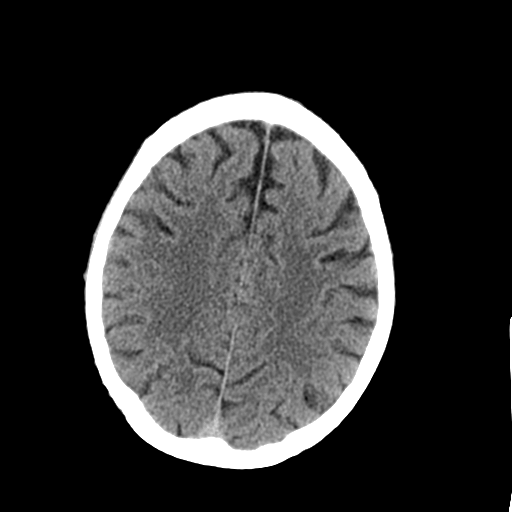
[im 24/33  brain]
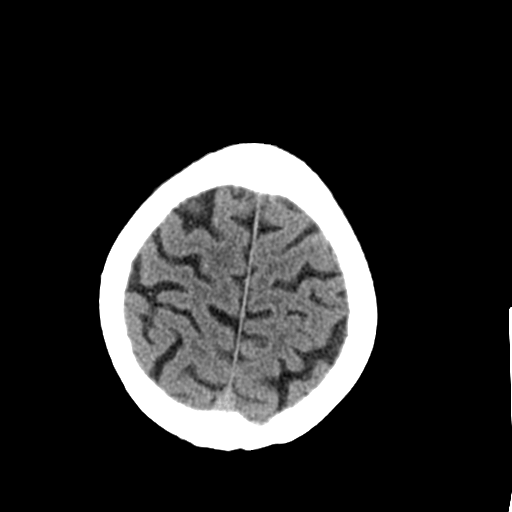
[im 27/33  brain]
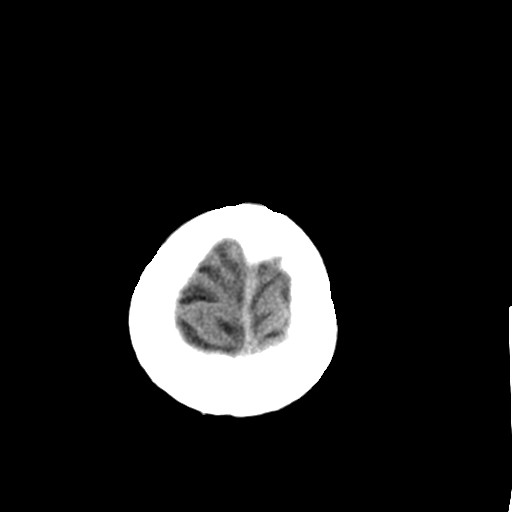
[im 30/33  brain]
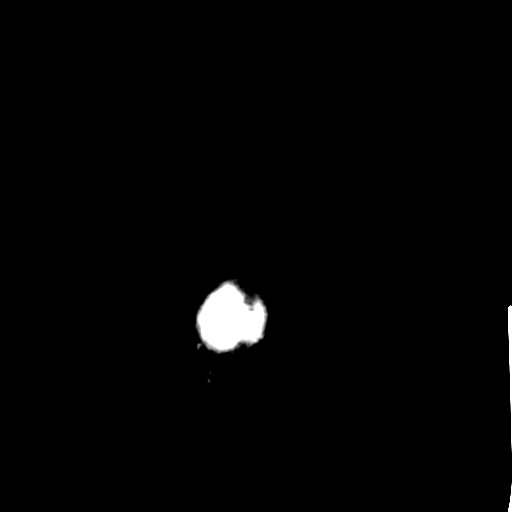
[im 30/33  bone]
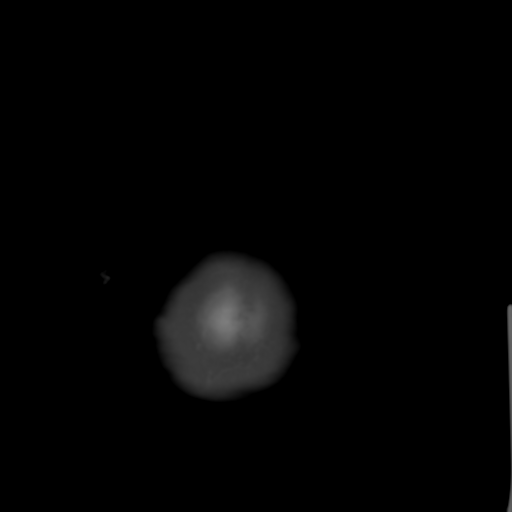

[Series 4: coronal soft · coronal · 0.31mm/px · 3 of 64 slices shown]
[im 22/64  brain]
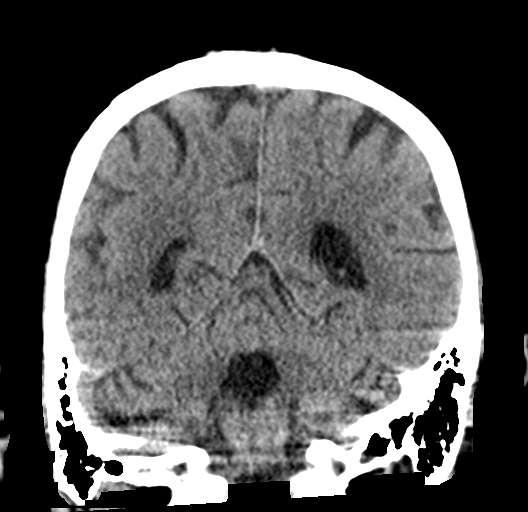
[im 29/64  brain]
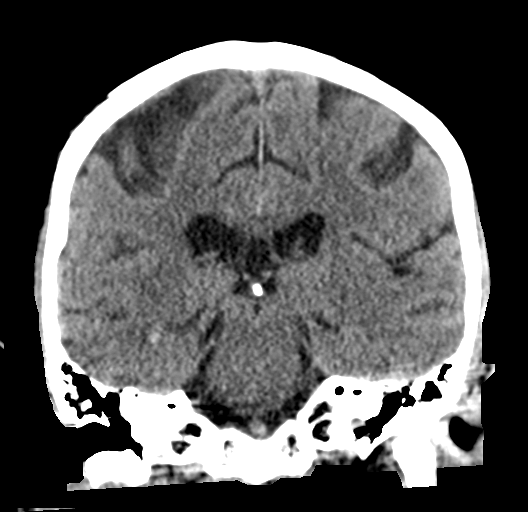
[im 36/64  brain]
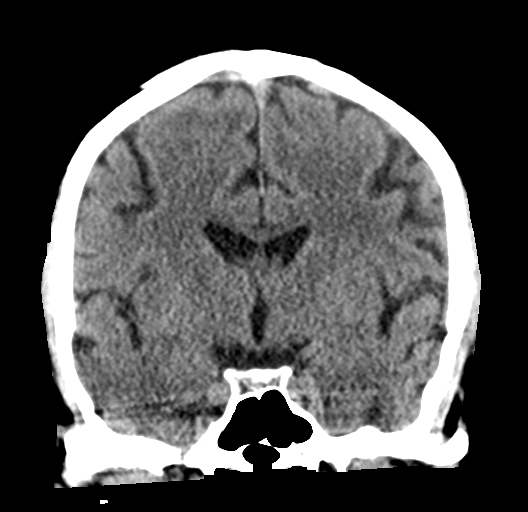

[Series 5: sagittal soft · sagittal · 0.30mm/px · 3 of 55 slices shown]
[im 19/55  brain]
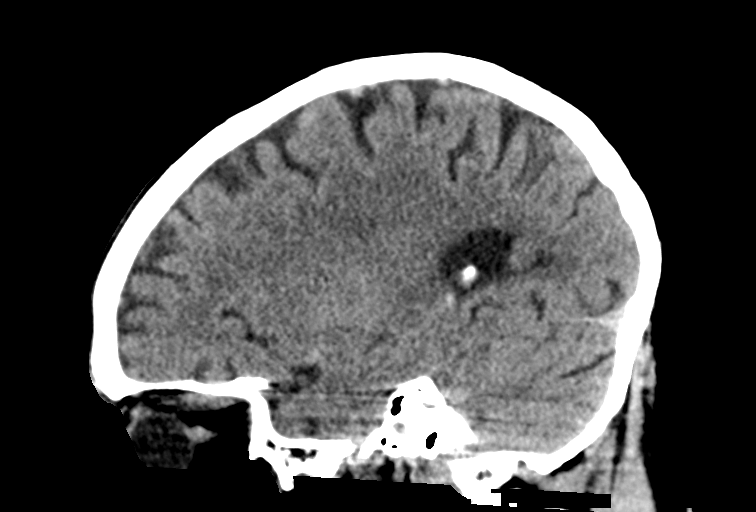
[im 28/55  brain]
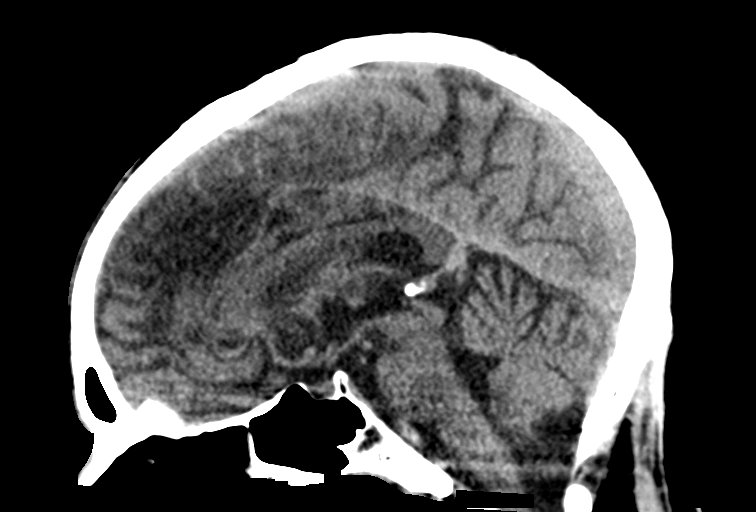
[im 37/55  brain]
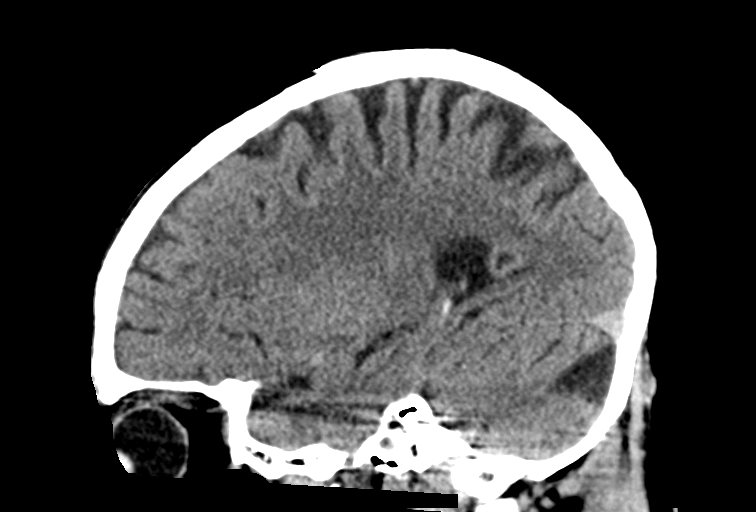

[15 of 47 positions shown; findings below may reference images not displayed]

FINDINGS: Brain: A remote posterior right cerebellar infarct is present. No
acute infarct, hemorrhage, or mass lesion is present. No significant
white matter lesions are present. The ventricles are of normal size.
No significant extraaxial fluid collection is present. The brainstem
and cerebellum are otherwise unremarkable.

Vascular: No hyperdense vessel or unexpected calcification.

Skull: No significant extracranial soft tissue lesion is present.
Calvarium is intact. No focal lytic or blastic lesions are present.

Sinuses/Orbits: The paranasal sinuses and mastoid air cells are
clear. The globes and orbits are within normal limits.
IMPRESSION: 1. No acute intracranial abnormality or significant interval change.
2. Remote posterior right cerebellar infarct.

## 2021-12-07 ENCOUNTER — Emergency Department (HOSPITAL_COMMUNITY)
Admission: EM | Admit: 2021-12-07 | Discharge: 2021-12-07 | Disposition: A | Discharge status: Home / Self Care | Payer: Self-pay | Attending: Emergency Medicine | Admitting: Emergency Medicine

## 2021-12-07 ENCOUNTER — Other Ambulatory Visit: Payer: Self-pay

## 2021-12-07 ENCOUNTER — Encounter (HOSPITAL_COMMUNITY): Payer: Self-pay

## 2021-12-07 DIAGNOSIS — B349 Viral infection, unspecified: Secondary | ICD-10-CM | POA: Insufficient documentation

## 2021-12-07 DIAGNOSIS — Z20822 Contact with and (suspected) exposure to covid-19: Secondary | ICD-10-CM | POA: Insufficient documentation

## 2021-12-07 LAB — RESP PANEL BY RT-PCR (FLU A&B, COVID) ARPGX2
Influenza A by PCR: NEGATIVE
Influenza B by PCR: NEGATIVE
SARS Coronavirus 2 by RT PCR: NEGATIVE

## 2021-12-07 NOTE — Discharge Instructions (Signed)
I suspect you had a viral illness, however since you are feeling improved you do not need any further testing today.  Your COVID and flu test today are negative.  I do recommend avoiding exposure to your father-in-law until his symptoms are completely resolved.  As long as you are feeling well you can return to work tomorrow as discussed.

## 2021-12-07 NOTE — ED Triage Notes (Signed)
Pt reports body aches and headaches. Pt reports dizziness. Pt was recently exposed to covid by his father.

## 2021-12-07 NOTE — ED Notes (Signed)
Updated pt on Covid/Flu results and pt given beverage.

## 2021-12-09 NOTE — ED Provider Notes (Signed)
[facility] EMERGENCY DEPARTMENT Provider Note   CSN: [id number] Arrival date & time: 12/07/21  1049     History  Chief Complaint  Patient presents with   Covid Exposure    Frederick Roth is a 56 y.o. male presenting with flu like symptoms including generalized body aches, headache,  lightheadedness, generalized fatigue and dry cough with subjective fever which started 2 days ago and now has significantly improved.  He reports was exposed to his father who was diagnosed with Covid this weekend.  He denies n/v/d,  abdominal pain, chest pain, sob and actually feels better today, but wanted to get screened for covid and will need a work note to be able to return which he hopes to do tomorrow.   The history is provided by the patient.       Home Medications Prior to Admission medications   Medication Sig Start Date End Date Taking? Authorizing Provider  cyclobenzaprine (FLEXERIL) 5 MG tablet Take 1 tablet (5 mg total) by mouth 3 (three) times daily as needed for muscle spasms. 06/03/19   [name], MD      Allergies    Patient has no known allergies.    Review of Systems   Review of Systems  Constitutional:  Positive for fever.  HENT:  Negative for congestion, rhinorrhea and sore throat.   Eyes: Negative.   Respiratory:  Positive for cough. Negative for chest tightness, shortness of breath and wheezing.   Cardiovascular:  Negative for chest pain.  Gastrointestinal:  Negative for abdominal pain, diarrhea, nausea and vomiting.  Genitourinary: Negative.   Musculoskeletal:  Positive for myalgias. Negative for arthralgias, joint swelling and neck pain.  Skin: Negative.  Negative for rash and wound.  Neurological:  Positive for light-headedness and headaches. Negative for dizziness, weakness and numbness.  Psychiatric/Behavioral: Negative.      Physical Exam Updated Vital Signs BP 113/79 (BP Location: Right Arm)   Pulse (!) 54   Temp 98.1 F (36.7 C) (Oral)    Resp 17   Ht 5\' 9"  (1.753 m)   Wt 60.3 kg   SpO2 100%   BMI 19.63 kg/m  Physical Exam Constitutional:      Appearance: He is well-developed.  HENT:     Head: Normocephalic and atraumatic.     Right Ear: Tympanic membrane and ear canal normal.     Left Ear: Tympanic membrane and ear canal normal.     Nose: Mucosal edema present. No rhinorrhea.     Mouth/Throat:     Mouth: Mucous membranes are moist.     Pharynx: Oropharynx is clear. Uvula midline. No oropharyngeal exudate or posterior oropharyngeal erythema.     Tonsils: No tonsillar abscesses.  Eyes:     Conjunctiva/sclera: Conjunctivae normal.  Cardiovascular:     Rate and Rhythm: Normal rate.     Heart sounds: Normal heart sounds.  Pulmonary:     Effort: Pulmonary effort is normal. No respiratory distress.     Breath sounds: No wheezing, rhonchi or rales.  Abdominal:     Palpations: Abdomen is soft.     Tenderness: There is no abdominal tenderness.  Musculoskeletal:        General: Normal range of motion.  Skin:    General: Skin is warm and dry.     Findings: No rash.  Neurological:     Mental Status: He is alert and oriented to person, place, and time.     ED Results / Procedures / Treatments  Labs (all labs ordered are listed, but only abnormal results are displayed) Labs Reviewed  RESP PANEL BY RT-PCR (FLU A&B, COVID) [id number]    EKG None  Radiology No results found.  Procedures Procedures    Medications Ordered in ED Medications - No data to display  ED Course/ Medical Decision Making/ A&P                           Medical Decision Making Patient presenting with flulike symptoms but now nearly resolved.  History including myalgias, dry cough, headache and fatigue.  Exam is reassuring with normal vital signs.  His respiratory panel is negative for flu and COVID.  Given patient's symptoms are almost resolved, he is afebrile, negative for COVID and negative for flu since he is feeling better and is  desiring return to work, no indication for him to remain home.  Work note was given.  As needed follow-up anticipated  Amount and/or Complexity of Data Reviewed Labs: ordered.    Details: Respiratory panel is negative. Radiology:     Details: No indication for imaging, normal pulmonary exam.          Final Clinical Impression(s) / ED Diagnoses Final diagnoses:  Acute viral syndrome    Rx / DC Orders ED Discharge Orders     None         Landis Martins 12/09/21 2131    Tretha Sciara, MD 12/10/21 [phone]
